# Patient Record
Sex: Female | Born: 1965 | Race: White | Hispanic: No | Marital: Single | Smoking: Never smoker
Health system: Southern US, Community
[De-identification: ages and names within clinical notes are randomized; demographics above are authoritative.]

## PROBLEM LIST (undated history)

## (undated) DIAGNOSIS — E669 Obesity, unspecified: Secondary | ICD-10-CM

## (undated) DIAGNOSIS — T7840XA Allergy, unspecified, initial encounter: Secondary | ICD-10-CM

## (undated) DIAGNOSIS — IMO0001 Reserved for inherently not codable concepts without codable children: Secondary | ICD-10-CM

## (undated) DIAGNOSIS — R51 Headache: Secondary | ICD-10-CM

## (undated) DIAGNOSIS — R112 Nausea with vomiting, unspecified: Secondary | ICD-10-CM

## (undated) DIAGNOSIS — G4730 Sleep apnea, unspecified: Secondary | ICD-10-CM

## (undated) DIAGNOSIS — K259 Gastric ulcer, unspecified as acute or chronic, without hemorrhage or perforation: Secondary | ICD-10-CM

## (undated) DIAGNOSIS — R519 Headache, unspecified: Secondary | ICD-10-CM

## (undated) DIAGNOSIS — K219 Gastro-esophageal reflux disease without esophagitis: Secondary | ICD-10-CM

## (undated) DIAGNOSIS — Z9889 Other specified postprocedural states: Secondary | ICD-10-CM

## (undated) HISTORY — PX: KNEE ARTHROSCOPY W/ ACL RECONSTRUCTION: SHX1858

## (undated) HISTORY — PX: MENISCUS REPAIR: SHX5179

## (undated) HISTORY — PX: MYOMECTOMY: SHX85

## (undated) HISTORY — DX: Obesity, unspecified: E66.9

## (undated) HISTORY — DX: Sleep apnea, unspecified: G47.30

## (undated) HISTORY — DX: Gastro-esophageal reflux disease without esophagitis: K21.9

## (undated) HISTORY — DX: Allergy, unspecified, initial encounter: T78.40XA

## (undated) HISTORY — DX: Gastric ulcer, unspecified as acute or chronic, without hemorrhage or perforation: K25.9

## (undated) HISTORY — PX: ABDOMINAL HYSTERECTOMY: SHX81

---

## 2010-07-01 HISTORY — PX: BREAST BIOPSY: SHX20

## 2011-08-26 DIAGNOSIS — G4730 Sleep apnea, unspecified: Secondary | ICD-10-CM

## 2011-08-26 HISTORY — DX: Sleep apnea, unspecified: G47.30

## 2012-04-22 ENCOUNTER — Emergency Department: Payer: Self-pay

## 2012-04-22 LAB — COMPREHENSIVE METABOLIC PANEL
Albumin: 3.8 g/dL (ref 3.4–5.0)
Anion Gap: 10 (ref 7–16)
BUN: 15 mg/dL (ref 7–18)
Bilirubin,Total: 0.3 mg/dL (ref 0.2–1.0)
Chloride: 107 mmol/L (ref 98–107)
EGFR (African American): >60
Glucose: 84 mg/dL (ref 65–99)
Osmolality: 279 (ref 275–301)
Potassium: 4.1 mmol/L (ref 3.5–5.1)
Sodium: 140 mmol/L (ref 136–145)
Total Protein: 7.1 g/dL (ref 6.4–8.2)

## 2012-04-22 LAB — CBC
HCT: 41.3 % (ref 35.0–47.0)
HGB: 13.7 g/dL (ref 12.0–16.0)
MCH: 28.7 pg (ref 26.0–34.0)
MCHC: 33.2 g/dL (ref 32.0–36.0)
MCV: 86 fL (ref 80–100)
Platelet: 289 10*3/uL (ref 150–440)
RDW: 13.6 % (ref 11.5–14.5)

## 2012-04-22 LAB — TROPONIN I: Troponin-I: <0.02 ng/mL

## 2012-04-22 LAB — LIPASE, BLOOD: Lipase: 179 U/L (ref 73–393)

## 2012-04-23 ENCOUNTER — Ambulatory Visit (INDEPENDENT_AMBULATORY_CARE_PROVIDER_SITE_OTHER): Payer: BC Managed Care – PPO | Admitting: Cardiovascular Disease

## 2012-04-23 DIAGNOSIS — R079 Chest pain, unspecified: Secondary | ICD-10-CM

## 2012-04-23 NOTE — Patient Instructions (Signed)
Your physician has requested that you have an echocardiogram. Echocardiography is a painless test that uses sound waves to create images of your heart. It provides your doctor with information about the size and shape of your heart and how well your heart's chambers and valves are working. This procedure takes approximately one hour. There are no restrictions for this procedure.  Follow up with Dr. Kirke Corin after test.

## 2012-04-23 NOTE — Procedures (Signed)
    Treadmill Stress test  Indication: Atypical chest pain.  Baseline Data:  Resting EKG shows NSR , 76 bpm, no significant ST or T wave changes. Resting blood pressure of 102/74 mm Hg Stand bruce protocal was used.  Exercise Data:  Patient exercised for 8 min 0 sec,  Peak heart rate of 168 bpm.  This was 96 % of the maximum predicted heart rate. No symptoms of chest pain or lightheadedness were reported at peak stress or in recovery.  Peak Blood pressure recorded was 138/82 Maximal work level: 10.1 METs.  Heart rate at 3 minutes in recovery was 98 bpm. BP response: Normal HR response: Normal  EKG with Exercise: Sinus tachycardia with no significant ST changes  FINAL IMPRESSION: Normal exercise stress test. No significant EKG changes concerning for ischemia. Very good exercise tolerance.  Recommendation: Echocardiogram.

## 2012-05-13 ENCOUNTER — Ambulatory Visit: Payer: BC Managed Care – PPO | Admitting: Internal Medicine

## 2012-05-21 ENCOUNTER — Other Ambulatory Visit: Payer: Self-pay

## 2012-05-21 ENCOUNTER — Other Ambulatory Visit (INDEPENDENT_AMBULATORY_CARE_PROVIDER_SITE_OTHER): Payer: BC Managed Care – PPO

## 2012-05-21 DIAGNOSIS — R079 Chest pain, unspecified: Secondary | ICD-10-CM

## 2012-05-23 ENCOUNTER — Encounter: Payer: Self-pay | Admitting: Cardiovascular Disease

## 2012-05-23 ENCOUNTER — Ambulatory Visit (INDEPENDENT_AMBULATORY_CARE_PROVIDER_SITE_OTHER): Payer: BC Managed Care – PPO | Admitting: Cardiovascular Disease

## 2012-05-23 VITALS — BP 125/81 | HR 90 | Ht 66.0 in | Wt 197.0 lb

## 2012-05-23 DIAGNOSIS — R0789 Other chest pain: Secondary | ICD-10-CM

## 2012-05-23 NOTE — Patient Instructions (Signed)
Your heart tests were fine.  Follow up as needed.  

## 2012-05-24 ENCOUNTER — Encounter: Payer: Self-pay | Admitting: Cardiovascular Disease

## 2012-05-24 DIAGNOSIS — R0789 Other chest pain: Secondary | ICD-10-CM | POA: Insufficient documentation

## 2012-05-24 NOTE — Assessment & Plan Note (Signed)
The patient had recent atypical chest pain which was likely due to stress. She had a cardiac workup that included a treadmill stress test and an echocardiogram. Both of them were normal. She has not had any further chest discomfort. No further cardiac testing is indicated at this time. The patient was instructed to followup with [place] as needed. She will be establishing with Dr. Darrick Huntsman has a primary care physician.

## 2012-05-24 NOTE — Progress Notes (Signed)
HPI  This is a 46 year old female who is here today for evaluation of chest pain. She has no significant risk factors for coronary artery disease and no previous cardiac history. She moved recently from [place] due to that the change in her job. She has been under stress since then. She had some episodes of substernal aching sensation mostly at rest and did not worsen with physical activities. She went to the emergency room at [facility] where her basic workup was unremarkable. She was referred to our office for further evaluation. She underwent a treadmill stress test which showed no evidence of ischemia. She had no chest pain on the treadmill. She had an echocardiogram done which showed no significant abnormalities. Overall she is feeling better and has not had any further chest discomfort.  No Known Allergies   Current Outpatient Prescriptions on File Prior to Visit  Medication Sig Dispense Refill  . cetirizine (ZYRTEC) 10 MG tablet Take 10 mg by mouth daily.         History reviewed. No pertinent past medical history.   Past Surgical History  Procedure Date  . Knee surgery     right knee acl reconstruction x2  . Abdominal hysterectomy      Family History  Problem Relation Age of Onset  . Heart disease Mother      History   Social History  . Marital Status: Married    Spouse Name: N/A    Number of Children: N/A  . Years of Education: N/A   Occupational History  . Not on file.   Social History Main Topics  . Smoking status: Never Smoker   . Smokeless tobacco: Not on file  . Alcohol Use: No  . Drug Use: No  . Sexually Active:    Other Topics Concern  . Not on file   Social History Narrative  . No narrative on file     ROS Constitutional: Negative for fever, chills, diaphoresis, activity change, appetite change and fatigue.  HENT: Negative for hearing loss, nosebleeds, congestion, sore throat, facial swelling, drooling, trouble swallowing, neck  pain, voice change, sinus pressure and tinnitus.  Eyes: Negative for photophobia, pain, discharge and visual disturbance.  Respiratory: Negative for apnea, cough shortness of breath and wheezing.  Cardiovascular: Negative for palpitations and leg swelling.  Gastrointestinal: Negative for nausea, vomiting, abdominal pain, diarrhea, constipation, blood in stool and abdominal distention.  Genitourinary: Negative for dysuria, urgency, frequency, hematuria and decreased urine volume.  Musculoskeletal: Negative for myalgias, back pain, joint swelling, arthralgias and gait problem.  Skin: Negative for color change, pallor, rash and wound.  Neurological: Negative for dizziness, tremors, seizures, syncope, speech difficulty, weakness, light-headedness, numbness and headaches.  Psychiatric/Behavioral: Negative for suicidal ideas, hallucinations, behavioral problems and agitation. The patient is not nervous/anxious.     PHYSICAL EXAM   BP 125/81  Pulse 90  Ht 5\' 6"  (1.676 m)  Wt 197 lb (89.359 kg)  BMI 31.80 kg/m2 Constitutional: She is oriented to person, place, and time. She appears well-developed and well-nourished. No distress.  HENT: No nasal discharge.  Head: Normocephalic and atraumatic.  Eyes: Pupils are equal and round. Right eye exhibits no discharge. Left eye exhibits no discharge.  Neck: Normal range of motion. Neck supple. No JVD present. No thyromegaly present.  Cardiovascular: Normal rate, regular rhythm, normal heart sounds. Exam reveals no gallop and no friction rub. No murmur heard.  Pulmonary/Chest: Effort normal and breath sounds normal. No stridor. No respiratory distress. She  has no wheezes. She has no rales. She exhibits no tenderness.  Abdominal: Soft. Bowel sounds are normal. She exhibits no distension. There is no tenderness. There is no rebound and no guarding.  Musculoskeletal: Normal range of motion. She exhibits no edema and no tenderness.  Neurological: She is alert  and oriented to person, place, and time. Coordination normal.  Skin: Skin is warm and dry. No rash noted. She is not diaphoretic. No erythema. No pallor.  Psychiatric: She has a normal mood and affect. Her behavior is normal. Judgment and thought content normal.      ASSESSMENT AND PLAN

## 2012-06-20 ENCOUNTER — Encounter: Payer: Self-pay | Admitting: Internal Medicine

## 2012-06-20 ENCOUNTER — Ambulatory Visit (INDEPENDENT_AMBULATORY_CARE_PROVIDER_SITE_OTHER): Payer: BC Managed Care – PPO | Admitting: Internal Medicine

## 2012-06-20 VITALS — BP 118/70 | HR 75 | Temp 98.0°F | Resp 16 | Ht 65.0 in | Wt 200.5 lb

## 2012-06-20 DIAGNOSIS — R0609 Other forms of dyspnea: Secondary | ICD-10-CM

## 2012-06-20 DIAGNOSIS — E669 Obesity, unspecified: Secondary | ICD-10-CM

## 2012-06-20 DIAGNOSIS — Z832 Family history of diseases of the blood and blood-forming organs and certain disorders involving the immune mechanism: Secondary | ICD-10-CM

## 2012-06-20 DIAGNOSIS — R0683 Snoring: Secondary | ICD-10-CM

## 2012-06-20 DIAGNOSIS — K5909 Other constipation: Secondary | ICD-10-CM

## 2012-06-20 DIAGNOSIS — L680 Hirsutism: Secondary | ICD-10-CM

## 2012-06-20 DIAGNOSIS — R5381 Other malaise: Secondary | ICD-10-CM

## 2012-06-20 DIAGNOSIS — R079 Chest pain, unspecified: Secondary | ICD-10-CM

## 2012-06-20 DIAGNOSIS — R0789 Other chest pain: Secondary | ICD-10-CM

## 2012-06-20 DIAGNOSIS — R0989 Other specified symptoms and signs involving the circulatory and respiratory systems: Secondary | ICD-10-CM

## 2012-06-20 DIAGNOSIS — K5904 Chronic idiopathic constipation: Secondary | ICD-10-CM

## 2012-06-20 DIAGNOSIS — R5383 Other fatigue: Secondary | ICD-10-CM

## 2012-06-20 LAB — BASIC METABOLIC PANEL
Chloride: 107 mEq/L (ref 96–112)
Potassium: 4.5 mEq/L (ref 3.5–5.1)
Sodium: 141 mEq/L (ref 135–145)

## 2012-06-20 LAB — C-REACTIVE PROTEIN: CRP: <1 mg/dL (ref 1–20)

## 2012-06-20 NOTE — Assessment & Plan Note (Signed)
Given her family history of VTE, relative hypoxia,   LE edema and pleuritic chest pain, need to rule out a PE with chest CT

## 2012-06-20 NOTE — Assessment & Plan Note (Signed)
Checking thyroid, ruling out DM and hyperlipidemia

## 2012-06-20 NOTE — Progress Notes (Addendum)
Patient ID: Audrey Blair, female   DOB: 1966/02/01, 46 y.o.   MRN: [id number]    Patient Active Problem List  Diagnosis  . Atypical chest pain  . Obesity (BMI 30-39.9)  . Family history of antiphospholipid syndrome  . Fatigue  . Female hirsutism  . Abdominal  pain, other specified site    Subjective:  CC:   Chief Complaint  Patient presents with  . New Patient    HPI:   Audrey Blair is a 46 y.o. female who presents as a new patient to establish primary care who recently relocated from [place], [place] about 3 months ago.  She presents with a list of test she is requesting to be run including tests for scleroderma, antiphospholipid antibody syndrome ,  testosterone levels, and a sleep study all.  She has a history of total hysterectomy in 2002 due to fibroids with myomectomy done first.  She has symptoms of abdominal discomfort, lower abdomen,  Feels tense, sometimes sharp pains,  Occurs more than monthly but less than daily.  Has frequent constipation relieved with stool softener with a stimulant laxative, which she uses once a week , sometimes more.  No prior colonoscopy.  Does not eat a high fiber diet but lots of fruits and vegetables. Drinks a few glasses of water daily.  Bowel have changed over the last year and a a half..,  No history of blood in stool.  Mother had breast ca at age in early 89's. Her mother also had antiphospholipid Ab syndrome and had  recurrent DVTs,  anemia requiring transfusions with history of gastric ulcers.  Her 2nd complaint constant fatigue, ankles and legs swell on a regular basis.  No history of DVT.  Some joint pain but history of ACL reconstruction and meniscal tear in right knee.  Feels achey.    3rd complaint is recurrent sharp chest pain radiating to under left breast,  Evaluated in ER 2 months ago with EKG and cxr which were normal, followed by a stress test and ECHO done during evaluation by Dr. Kirke Corin that were both normal .  The pain is Intermittent  throughout the day and occurs at rest. In a period of constipation. No recent travel , none since May ,  No car rides longer than an hour. Not sleeping well, waking  up frequently.  Lives alone unmarried, but family members  Have commented on snoring and apneic type breathing pattern.   Menarche at age 80,  Had continuous periods until she was put on some kind of "experimetral drug" to regulate her periods. Has hirsutism. Wants her ovaries and hormones checked.     Past Medical History  Diagnosis Date  . Obesity (BMI 30-39.9)     Past Surgical History  Procedure Date  . Knee surgery     right knee acl reconstruction x2  . Abdominal hysterectomy     Family History  Problem Relation Age of Onset  . Heart disease Mother     valvular vardiomyopathy  . Diabetes Mother   . Deep vein thrombosis Mother   . Heart disease Maternal Grandmother     heart failure, sick sinus    History   Social History  . Marital Status: Married    Spouse Name: N/A    Number of Children: N/A  . Years of Education: N/A   Occupational History  . Not on file.   Social History Main Topics  . Smoking status: Never Smoker   . Smokeless tobacco: Never Used  .  Alcohol Use: No  . Drug Use: No  . Sexually Active: Not on file   Other Topics Concern  . Not on file   Social History Narrative  . No narrative on file       No Known Allergies  Review of Systems:   The remainder of the review of systems was negative except those addressed in the HPI.   Objective:  BP 118/70  Pulse 75  Temp 98 F (36.7 C) (Oral)  Resp 16  Ht 5\' 5"  (1.651 m)  Wt 200 lb 8 oz (90.946 kg)  BMI 33.36 kg/m2  SpO2 94%  General appearance: alert, obese ooperative and appears stated age Ears: normal TM's and external ear canals both ears Throat: lips, mucosa, and tongue normal; teeth and gums normal Neck: no adenopathy, no carotid bruit, supple, symmetrical, trachea midline and thyroid not enlarged, symmetric, no  tenderness/mass/nodules Back: symmetric, no curvature. ROM normal. No CVA tenderness. Lungs: clear to auscultation bilaterally Heart: regular rate and rhythm, S1, S2 normal, no murmur, click, rub or gallop Abdomen: soft, non-tender; bowel sounds normal; no masses,  no organomegaly Pulses: 2+ and symmetric Skin: Skin color, texture, turgor normal. No rashes or lesions Lymph nodes: Cervical, supraclavicular, and axillary nodes normal.  Assessment and Plan:  Atypical chest pain Given her family history of VTE, relative hypoxia,   LE edema and pleuritic chest pain, need to rule out a PE with chest CT  Obesity (BMI 30-39.9) Checking thyroid, ruling out DM and hyperlipidemia  Fatigue Sleep study ordered to investigate symptoms of edema, fatigue, obesity and snoring   Female hirsutism Accompanied by edema.  Trial of spironolactone 50 mg daily    Updated Medication List Outpatient Encounter Prescriptions as of 06/20/2012  Medication Sig Dispense Refill  . cetirizine (ZYRTEC) 10 MG tablet Take 10 mg by mouth daily.      . IBUPROFEN PO Take 600 mg by mouth as needed.      Marland Kitchen spironolactone (ALDACTONE) 50 MG tablet 1 to 2 tablets daily as needed for edema  60 tablet  3     Orders Placed This Encounter  Procedures  . CT Chest W Contrast  . TSH  . Magnesium  . Hemoglobin A1c  . ANA  . C-reactive protein  . Basic metabolic panel  . Anti-nuclear ab-titer (ANA titer)  . Nocturnal polysomnography (NPSG)    Return in about 1 month (around 07/20/2012).

## 2012-06-20 NOTE — Assessment & Plan Note (Signed)
Sleep study ordered to investigate symptoms of edema, fatigue, obesity and snoring

## 2012-06-20 NOTE — Patient Instructions (Signed)
We will email you results as they come in .   Setting you up for CT chest and a sleep study,  We will call you with those appts.

## 2012-06-21 ENCOUNTER — Other Ambulatory Visit: Payer: Self-pay | Admitting: Internal Medicine

## 2012-06-21 DIAGNOSIS — L680 Hirsutism: Secondary | ICD-10-CM | POA: Insufficient documentation

## 2012-06-21 MED ORDER — SPIRONOLACTONE 50 MG PO TABS: ORAL_TABLET | ORAL | Status: DC

## 2012-06-21 NOTE — Addendum Note (Signed)
Addended by: Duncan Dull on: 06/21/2012 12:55 PM   Modules accepted: Orders

## 2012-06-21 NOTE — Assessment & Plan Note (Signed)
Accompanied by edema.  Trial of spironolactone 50 mg daily

## 2012-06-23 ENCOUNTER — Encounter: Payer: Self-pay | Admitting: Internal Medicine

## 2012-06-24 ENCOUNTER — Telehealth: Payer: Self-pay | Admitting: Internal Medicine

## 2012-06-24 DIAGNOSIS — M79669 Pain in unspecified lower leg: Secondary | ICD-10-CM

## 2012-06-24 LAB — ANTI-NUCLEAR AB-TITER (ANA TITER): ANA Titer 1: 1:80 {titer} — ABNORMAL HIGH

## 2012-06-24 LAB — ANA: Anti Nuclear Antibody(ANA): POSITIVE — AB

## 2012-06-25 ENCOUNTER — Ambulatory Visit: Payer: Self-pay | Admitting: Internal Medicine

## 2012-06-26 ENCOUNTER — Encounter: Payer: Self-pay | Admitting: Internal Medicine

## 2012-06-26 ENCOUNTER — Telehealth: Payer: Self-pay | Admitting: Internal Medicine

## 2012-06-26 DIAGNOSIS — R768 Other specified abnormal immunological findings in serum: Secondary | ICD-10-CM

## 2012-06-27 ENCOUNTER — Other Ambulatory Visit: Payer: Self-pay | Admitting: Internal Medicine

## 2012-06-27 DIAGNOSIS — K2970 Gastritis, unspecified, without bleeding: Secondary | ICD-10-CM

## 2012-06-27 MED ORDER — ESOMEPRAZOLE MAGNESIUM 40 MG PO CPDR: 40.0000 mg | DELAYED_RELEASE_CAPSULE | Freq: Every day | ORAL | Status: DC

## 2012-06-28 ENCOUNTER — Ambulatory Visit: Payer: Self-pay | Admitting: Internal Medicine

## 2012-07-02 ENCOUNTER — Ambulatory Visit: Payer: BC Managed Care – PPO | Admitting: Internal Medicine

## 2012-07-12 ENCOUNTER — Encounter: Payer: Self-pay | Admitting: Internal Medicine

## 2012-07-15 ENCOUNTER — Encounter: Payer: Self-pay | Admitting: Internal Medicine

## 2012-07-15 NOTE — Telephone Encounter (Signed)
Opened in error

## 2012-07-15 NOTE — Telephone Encounter (Signed)
OPened in error.

## 2012-07-16 ENCOUNTER — Encounter: Payer: Self-pay | Admitting: Internal Medicine

## 2012-09-09 ENCOUNTER — Encounter: Payer: Self-pay | Admitting: Internal Medicine

## 2012-09-12 ENCOUNTER — Ambulatory Visit: Payer: Self-pay | Admitting: Internal Medicine

## 2012-09-12 ENCOUNTER — Encounter: Payer: Self-pay | Admitting: Internal Medicine

## 2012-09-12 ENCOUNTER — Telehealth: Payer: Self-pay | Admitting: Internal Medicine

## 2012-09-12 MED ORDER — ZOLPIDEM TARTRATE 5 MG PO TABS: 5.0000 mg | ORAL_TABLET | Freq: Every evening | ORAL | Status: DC | PRN

## 2012-09-12 NOTE — Telephone Encounter (Signed)
Please call in the prescription for Ambien 5 mg per chart let patient know she should start with 1 tablet about one hour before bedtime she may repeat this in 30 minutes if she's not feeling sleepy.

## 2012-09-12 NOTE — Telephone Encounter (Signed)
This is a MyChart message from patient:  Dr Darrick Huntsman: I have my sleep study tonight. Originally you had mentioned prescribing ambien to help me sleep. it is possible for me to get a prescription today? If it's too late, that's fine. Thank you! Audrey Blair

## 2012-09-12 NOTE — Telephone Encounter (Signed)
Let detailed message notified patient, Rx called in.

## 2012-09-24 ENCOUNTER — Telehealth: Payer: Self-pay | Admitting: Internal Medicine

## 2012-09-30 ENCOUNTER — Encounter: Payer: Self-pay | Admitting: Internal Medicine

## 2012-10-18 ENCOUNTER — Ambulatory Visit (INDEPENDENT_AMBULATORY_CARE_PROVIDER_SITE_OTHER): Payer: BC Managed Care – PPO | Admitting: Internal Medicine

## 2012-10-18 ENCOUNTER — Encounter: Payer: Self-pay | Admitting: Internal Medicine

## 2012-10-18 VITALS — BP 115/64 | HR 83 | Temp 98.7°F | Ht 66.0 in | Wt 210.5 lb

## 2012-10-18 DIAGNOSIS — G8929 Other chronic pain: Secondary | ICD-10-CM

## 2012-10-18 DIAGNOSIS — R768 Other specified abnormal immunological findings in serum: Secondary | ICD-10-CM

## 2012-10-18 DIAGNOSIS — R894 Abnormal immunological findings in specimens from other organs, systems and tissues: Secondary | ICD-10-CM

## 2012-10-18 DIAGNOSIS — R5383 Other fatigue: Secondary | ICD-10-CM

## 2012-10-18 DIAGNOSIS — R1013 Epigastric pain: Secondary | ICD-10-CM

## 2012-10-18 DIAGNOSIS — R109 Unspecified abdominal pain: Secondary | ICD-10-CM | POA: Insufficient documentation

## 2012-10-18 DIAGNOSIS — R5381 Other malaise: Secondary | ICD-10-CM

## 2012-10-18 DIAGNOSIS — G4730 Sleep apnea, unspecified: Secondary | ICD-10-CM

## 2012-10-18 NOTE — Progress Notes (Signed)
Patient ID: Audrey Blair, female   DOB: Jan 19, 1966, 46 y.o.   MRN: [id number]  Patient Active Problem List  Diagnosis  . Obesity (BMI 30-39.9)  . Family history of antiphospholipid syndrome  . Fatigue  . Female hirsutism  . Abdominal  pain, other specified site  . Sleep apnea syndrome  . Positive ANA (antinuclear antibody)    Subjective:  CC:   Chief Complaint  Patient presents with  . Follow-up    HPI:   Audrey Blair a 46 y.o. female who returns for follow up on multiple issues voiced at first office visit in June. 1) She continues to have persistent pain that is located under her breasts that radiates to her xiphoid area.  The pain is constant and has been present for thee past 4 months .  It is not affected by deep breathing, eating or stooling.  It is aggravated by leaning on her side or bending over.   It did not improve with a 3 month trial of Nexium.  It does not occur with exercise but she does not exercise regularly.  It did not occur with her treadmill test several months ago.  2) Sleep apnea.  Her sleep study was positive but she has not followed up with a CPAP titration study because she does not think she could tolerate the CPAP mask. 3) Obesity.  She has gained 10 lbs since her visit in June, and 24 since April 2013.  She has been researching several weight loss programs which I am not familiar with and wanted my opinions on them. 4) Positive ANA.  She was referred to dr. Gavin Potters for rheumatologic evaluation given her mother's history  Of APLA syndrome.    Past Medical History  Diagnosis Date  . Obesity (BMI 30-39.9)   . Sleep apnea syndrome Sept 2012    diagnosed with positive sleep study     Past Surgical History  Procedure Date  . Knee surgery     right knee acl reconstruction x2  . Abdominal hysterectomy          The following portions of the patient's history were reviewed and updated as appropriate: Allergies, current medications, and problem  list.    Review of Systems:   12 Pt  review of systems was negative except those addressed in the HPI,     History   Social History  . Marital Status: Married    Spouse Name: N/A    Number of Children: N/A  . Years of Education: N/A   Occupational History  . Not on file.   Social History Main Topics  . Smoking status: Never Smoker   . Smokeless tobacco: Never Used  . Alcohol Use: No  . Drug Use: No  . Sexually Active: Not on file   Other Topics Concern  . Not on file   Social History Narrative  . No narrative on file    Objective:  BP 115/64  Pulse 83  Temp 98.7 F (37.1 C) (Oral)  Ht 5\' 6"  (1.676 m)  Wt 210 lb 8 oz (95.482 kg)  BMI 33.98 kg/m2  SpO2 96%  General appearance: alert, cooperative and appears stated age Ears: normal TM's and external ear canals both ears Throat: lips, mucosa, and tongue normal; teeth and gums normal Neck: no adenopathy, no carotid bruit, supple, symmetrical, trachea midline and thyroid not enlarged, symmetric, no tenderness/mass/nodules Back: symmetric, no curvature. ROM normal. No CVA tenderness. Lungs: clear to auscultation bilaterally Heart: regular rate and rhythm,  S1, S2 normal, no murmur, click, rub or gallop Abdomen: soft, non-tender; bowel sounds normal; no masses,  no organomegaly Pulses: 2+ and symmetric Skin: Skin color, texture, turgor normal. No rashes or lesions Lymph nodes: Cervical, supraclavicular, and axillary nodes normal.  Assessment and Plan:  Positive ANA (antinuclear antibody) In the absence of any physical signs of mixed connective tissue disease scleroderma or Renaud's.  No signs of APLA .  Positive test managed by Dr. Gavin Potters. Follow up in one year,  Sooner if she develops symptoms.   Fatigue Secondary to deconditioining and sleep apnea,  Encourage to exercise,  Low GI diet recommended.   Abdominal  pain, other specified site Thus far she has had cardiology and pulmonary causes ruled out. No  change with PPI trial x 3 months.   Referral to GI in process.   Sleep apnea syndrome Patient refuses to use CPAP. Encouraged to lose weight . Risks of untreated OSA defined.    Updated Medication List Outpatient Encounter Prescriptions as of 10/18/2012  Medication Sig Dispense Refill  . cetirizine (ZYRTEC) 10 MG tablet Take 10 mg by mouth daily.      . IBUPROFEN PO Take 600 mg by mouth as needed.      Marland Kitchen spironolactone (ALDACTONE) 50 MG tablet 1 to 2 tablets daily as needed for edema  60 tablet  3  . zolpidem (AMBIEN) 5 MG tablet Take 1 tablet (5 mg total) by mouth at bedtime as needed for sleep (may repeat in 30 minutes if needed).  30 tablet  0  . esomeprazole (NEXIUM) 40 MG capsule Take 1 capsule (40 mg total) by mouth daily.  30 capsule  3     Orders Placed This Encounter  Procedures  . Ambulatory referral to Gastroenterology    No Follow-up on file.

## 2012-10-20 ENCOUNTER — Encounter: Payer: Self-pay | Admitting: Internal Medicine

## 2012-10-20 DIAGNOSIS — G4730 Sleep apnea, unspecified: Secondary | ICD-10-CM | POA: Insufficient documentation

## 2012-10-20 DIAGNOSIS — R768 Other specified abnormal immunological findings in serum: Secondary | ICD-10-CM | POA: Insufficient documentation

## 2012-10-20 NOTE — Assessment & Plan Note (Signed)
Secondary to deconditioining and sleep apnea,  Encourage to exercise,  Low GI diet recommended.

## 2012-10-20 NOTE — Assessment & Plan Note (Signed)
Thus far she has had cardiology and pulmonary causes ruled out. No change with PPI trial x 3 months.   Referral to GI in process.

## 2012-10-20 NOTE — Assessment & Plan Note (Addendum)
Patient refuses to use CPAP. Encouraged to lose weight . Risks of untreated OSA defined.

## 2012-10-20 NOTE — Assessment & Plan Note (Signed)
In the absence of any physical signs of mixed connective tissue disease scleroderma or Renaud's.  No signs of APLA .  Positive test managed by Dr. Gavin Potters. Follow up in one year,  Sooner if she develops symptoms.

## 2012-10-22 ENCOUNTER — Encounter: Payer: Self-pay | Admitting: Internal Medicine

## 2012-10-25 ENCOUNTER — Encounter: Payer: Self-pay | Admitting: Gastroenterology

## 2012-11-07 ENCOUNTER — Ambulatory Visit: Payer: BC Managed Care – PPO | Admitting: Gastroenterology

## 2012-12-04 ENCOUNTER — Ambulatory Visit: Payer: BC Managed Care – PPO | Admitting: Internal Medicine

## 2012-12-09 ENCOUNTER — Ambulatory Visit (INDEPENDENT_AMBULATORY_CARE_PROVIDER_SITE_OTHER): Payer: BC Managed Care – PPO | Admitting: Gastroenterology

## 2012-12-09 ENCOUNTER — Encounter: Payer: Self-pay | Admitting: Gastroenterology

## 2012-12-09 VITALS — BP 124/62 | HR 67 | Ht 65.0 in | Wt 209.0 lb

## 2012-12-09 DIAGNOSIS — R109 Unspecified abdominal pain: Secondary | ICD-10-CM

## 2012-12-09 MED ORDER — HYOSCYAMINE SULFATE ER 0.375 MG PO TBCR: EXTENDED_RELEASE_TABLET | ORAL | Status: DC

## 2012-12-09 NOTE — Assessment & Plan Note (Signed)
3 month history of upper epigastric pain. Symptoms may be related to NSAID-use. Ulcer or nonulcer dyspepsia or are strong possibilities. Pain from biliary tract disease is much less likely.  Recommendations  #1 trial of hyomax 0.375 mg twice a day #2 hold NSAIDs #3 upper endoscopy

## 2012-12-09 NOTE — Progress Notes (Signed)
History of Present Illness:  46 year old white female referred for evaluation of abdominal pain. For the past 3 months she's been suffering from upper epigastric pain that radiates to both sides. Pain is often spontaneous but may be exacerbated by eating. She denies nausea, pyrosis or vomiting. She took Nexium for a couple of months without improvement. He had been taking ibuprofen regularly but now takes only twice a week. Weight has been stable.    Past Medical History  Diagnosis Date  . Obesity (BMI 30-39.9)   . Sleep apnea syndrome Sept 2012    diagnosed with positive sleep study   . GERD (gastroesophageal reflux disease)    Past Surgical History  Procedure Date  . Knee surgery     right knee acl reconstruction x2  . Abdominal hysterectomy   . Myomectomy    family history includes Deep vein thrombosis in her mother; Diabetes in her mother; and Heart disease in her maternal grandmother and mother.  There is no history of Colon cancer. Current Outpatient Prescriptions  Medication Sig Dispense Refill  . cetirizine (ZYRTEC) 10 MG tablet Take 10 mg by mouth daily.      . IBUPROFEN PO Take 600 mg by mouth as needed.      . zolpidem (AMBIEN) 5 MG tablet Take 1 tablet (5 mg total) by mouth at bedtime as needed for sleep (may repeat in 30 minutes if needed).  30 tablet  0   Allergies as of 12/09/2012  . (No Known Allergies)    reports that she has never smoked. She has never used smokeless tobacco. She reports that she does not drink alcohol or use illicit drugs.     Review of Systems: Pertinent positive and negative review of systems were noted in the above HPI section. All other review of systems were otherwise negative.  Vital signs were reviewed in today's medical record Physical Exam: General: Well developed , well nourished, no acute distress Head: Normocephalic and atraumatic Eyes:  sclerae anicteric, EOMI Ears: Normal auditory acuity Mouth: No deformity or lesions Neck:  Supple, no masses or thyromegaly Lungs: Clear throughout to auscultation Heart: Regular rate and rhythm; no murmurs, rubs or bruits Abdomen: Soft,  and non distended. No masses, hepatosplenomegaly or hernias noted. Normal Bowel sounds.  There is very mild tenderness to palpation in the subxiphoid area Rectal:deferred Musculoskeletal: Symmetrical with no gross deformities  Skin: No lesions on visible extremities Pulses:  Normal pulses noted Extremities: No clubbing, cyanosis, edema or deformities noted Neurological: Alert oriented x 4, grossly nonfocal Cervical Nodes:  No significant cervical adenopathy Inguinal Nodes: No significant inguinal adenopathy Psychological:  Alert and cooperative. Normal mood and affect

## 2012-12-10 ENCOUNTER — Ambulatory Visit (AMBULATORY_SURGERY_CENTER): Payer: BC Managed Care – PPO | Admitting: Gastroenterology

## 2012-12-10 ENCOUNTER — Encounter: Payer: Self-pay | Admitting: Gastroenterology

## 2012-12-10 VITALS — BP 120/82 | HR 72 | Temp 97.8°F | Resp 16 | Ht 65.0 in | Wt 209.0 lb

## 2012-12-10 DIAGNOSIS — K259 Gastric ulcer, unspecified as acute or chronic, without hemorrhage or perforation: Secondary | ICD-10-CM

## 2012-12-10 DIAGNOSIS — K2960 Other gastritis without bleeding: Secondary | ICD-10-CM

## 2012-12-10 DIAGNOSIS — R1013 Epigastric pain: Secondary | ICD-10-CM

## 2012-12-10 MED ORDER — PANTOPRAZOLE SODIUM 40 MG PO TBEC: 40.0000 mg | DELAYED_RELEASE_TABLET | Freq: Every day | ORAL | Status: DC

## 2012-12-10 MED ORDER — SODIUM CHLORIDE 0.9 % IV SOLN: 500.0000 mL | INTRAVENOUS | Status: DC

## 2012-12-10 NOTE — Op Note (Signed)
[facility] [address] [place], [address]   ENDOSCOPY PROCEDURE REPORT  PATIENT: Audrey Blair, Audrey Blair  MR#: [id number] BIRTHDATE: May 21, 1966 , 46  yrs. old GENDER: Female ENDOSCOPIST: Louis Meckel, MD REFERRED BY: PROCEDURE DATE:  12/10/2012 PROCEDURE:  EGD w/ biopsy ASA CLASS:     Class II INDICATIONS: MEDICATIONS: MAC sedation, administered by CRNA and propofol (Diprivan) 150mg  IV TOPICAL ANESTHETIC:  DESCRIPTION OF PROCEDURE: After the risks benefits and alternatives of the procedure were thoroughly explained, informed consent was obtained.  The [id number]-GIF [id number] [id number] endoscope was introduced through the mouth and advanced to the third portion of the duodenum. Without limitations.  The instrument was slowly withdrawn as the mucosa was fully examined.        STOMACH: Linear and shallow ulcer(s) ranging between 3-51mm in size were found in the gastric antrum.  Biopsies were taken at edge of the ulcer.  The remainder of the upper endoscopy exam was otherwise normal. Retroflexed views revealed no abnormalities.     The scope was then withdrawn from the patient and the procedure completed.  COMPLICATIONS: There were no complications. ENDOSCOPIC IMPRESSION: 1.   Ulcer(s) ranging between 3-67mm in size were found in the gastric antrum; biopsies 2.   The remainder of the upper endoscopy exam was otherwise normal  RECOMMENDATIONS: 1.  Avoid NSAIDS for two weeks 2.  Protonix 40 mg qd  REPEAT EXAM:  eSigned:  Louis Meckel, MD 12/10/2012 8:20 AM   CC:

## 2012-12-10 NOTE — Progress Notes (Signed)
Patient did not have preoperative order for IV antibiotic SSI prophylaxis. (G8918)  Patient did not experience any of the following events: a burn prior to discharge; a fall within the facility; wrong site/side/patient/procedure/implant event; or a hospital transfer or hospital admission upon discharge from the facility. (G8907)  

## 2012-12-10 NOTE — Progress Notes (Addendum)
Called to room to assist during endoscopic procedure.  Patient ID and intended procedure confirmed with present staff. Received instructions for my participation in the procedure from the performing physician.- Clide Cliff RN   Pt is a HIPAA pt.  I placed her procedure report, information, and AVS in a sealed envelope for her.  Dr.Kaplan came in and discussed results with her.  Then, I called her carepartner back to be with her and discussed the pt's do's and don'ts with her.  No results given to care partner  On AVS, I did only reviewed her diet, activity, phone numbers to call if needed, and symptoms to call about.

## 2012-12-10 NOTE — Patient Instructions (Addendum)
YOU HAD AN ENDOSCOPIC PROCEDURE TODAY AT THE [facility]: Refer to the procedure report that was given to you for any specific questions about what was found during the examination.  If the procedure report does not answer your questions, please call your gastroenterologist to clarify.  If you requested that your care partner not be given the details of your procedure findings, then the procedure report has been included in a sealed envelope for you to review at your convenience later.  YOU SHOULD EXPECT: Some feelings of bloating in the abdomen. Passage of more gas than usual.  Walking can help get rid of the air that was put into your GI tract during the procedure and reduce the bloating.  DIET: Your first meal following the procedure should be a light meal and then it is ok to progress to your normal diet.  A half-sandwich or bowl of soup is an example of a good first meal.  Heavy or fried foods are harder to digest and may make you feel nauseous or bloated.  Likewise meals heavy in dairy and vegetables can cause extra gas to form and this can also increase the bloating.  Drink plenty of fluids but you should avoid alcoholic beverages for 24 hours.  ACTIVITY: Your care partner should take you home directly after the procedure.  You should plan to take it easy, moving slowly for the rest of the day.  You can resume normal activity the day after the procedure however you should NOT DRIVE or use heavy machinery for 24 hours (because of the sedation medicines used during the test).    SYMPTOMS TO REPORT IMMEDIATELY: A gastroenterologist can be reached at any hour.  During normal business hours, 8:30 AM to 5:00 PM Monday through Friday, call [phone].  After hours and on weekends, please call the GI answering service at [phone] who will take a message and have the physician on call contact you.  Following upper endoscopy (EGD)  Vomiting of blood or coffee ground material  New  chest pain or pain under the shoulder blades  Painful or persistently difficult swallowing  New shortness of breath  Fever of 100F or higher  Black, tarry-looking stools  FOLLOW UP: If any biopsies were taken you will be contacted by phone or by letter within the next 1-3 weeks.  Call your gastroenterologist if you have not heard about the biopsies in 3 weeks.  Our staff will call the home number listed on your records the next business day following your procedure to check on you and address any questions or concerns that you may have at that time regarding the information given to you following your procedure. This is a courtesy call and so if there is no answer at the home number and we have not heard from you through the emergency physician on call, we will assume that you have returned to your regular daily activities without incident.  SIGNATURES/CONFIDENTIALITY: You and/or your care partner have signed paperwork which will be entered into your electronic medical record.  These signatures attest to the fact that that the information above on your After Visit Summary has been reviewed and is understood.  Full responsibility of the confidentiality of this discharge information lies with you and/or your care-partner.   Avoid NSAIDS (anti-inflammatories) for 2 weeks

## 2012-12-10 NOTE — Progress Notes (Signed)
Pt stable in [place]

## 2012-12-11 ENCOUNTER — Telehealth: Payer: Self-pay | Admitting: *Deleted

## 2012-12-11 NOTE — Telephone Encounter (Signed)
No answer left message to call if questions or concerns. 

## 2012-12-19 ENCOUNTER — Encounter: Payer: Self-pay | Admitting: Gastroenterology

## 2013-02-08 ENCOUNTER — Other Ambulatory Visit: Payer: Self-pay

## 2013-03-03 ENCOUNTER — Other Ambulatory Visit: Payer: Self-pay | Admitting: Gastroenterology

## 2013-05-25 IMAGING — US US EXTREM LOW VENOUS*R*
1 series · 14 of 24 positions shown · non-contrast
Comparison: none

REASON FOR EXAM: right calf pain and swelling
COMMENTS:

PROCEDURE:     PHUC - PHUC DOPPLER VEINS RT LEG EXTR  - June 28, 2012  [DATE]
RESULT:     Comparison: None

[Series 1: us extrem low venous*right* · 0.10mm/px · 14 of 24 slices shown]
[im 1/24]
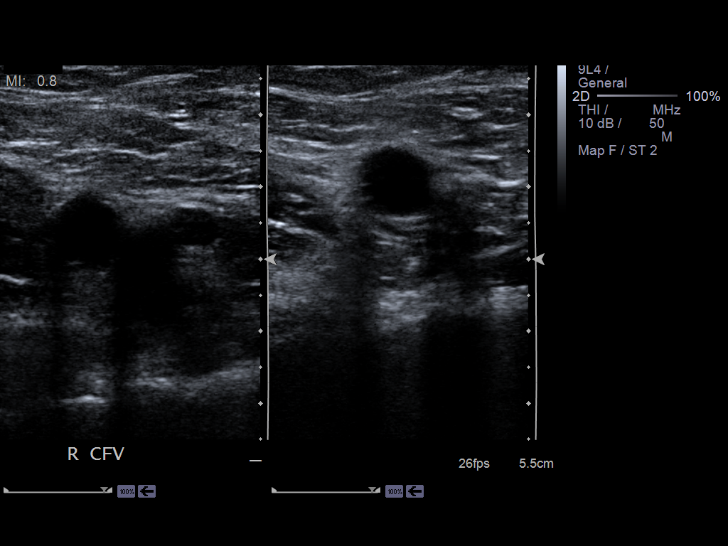
[im 3/24]
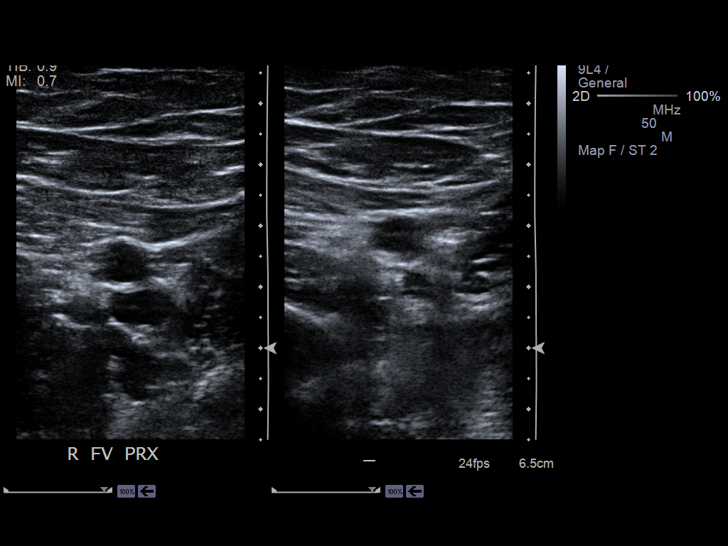
[im 5/24]
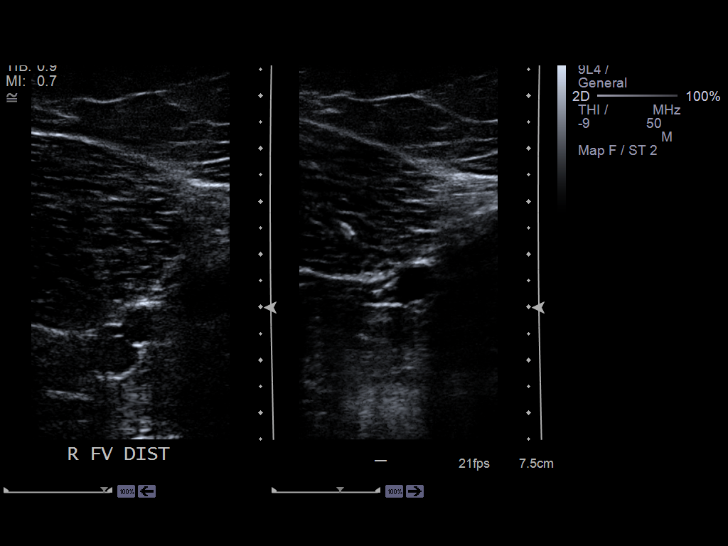
[im 7/24]
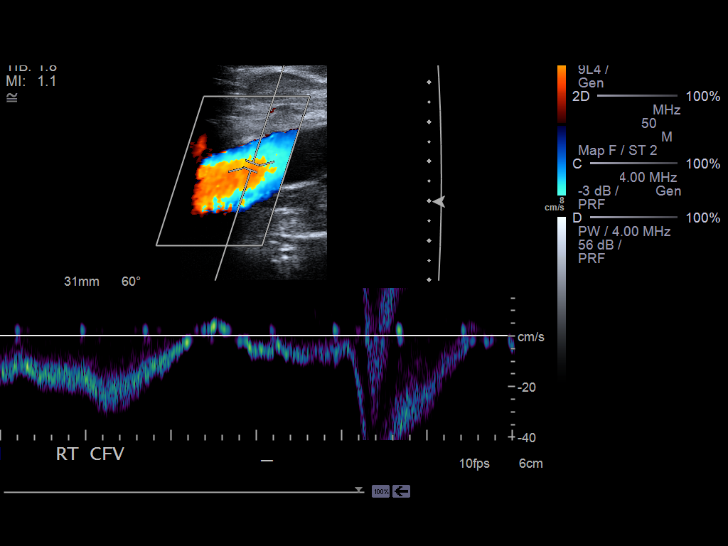
[im 8/24]
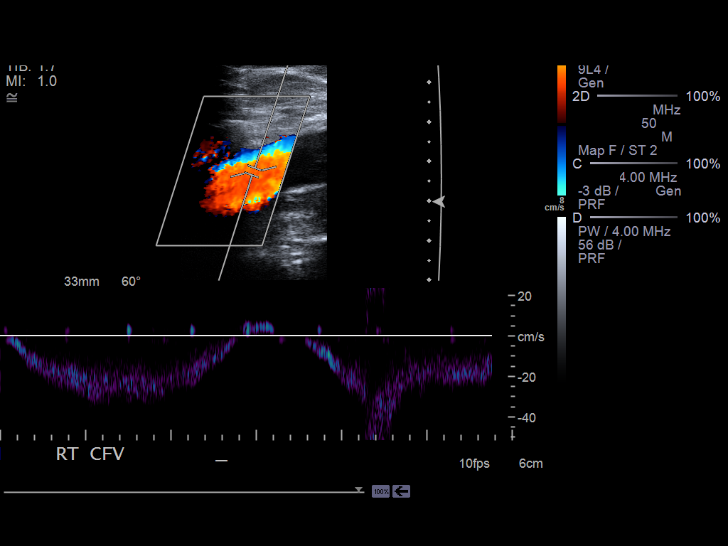
[im 10/24]
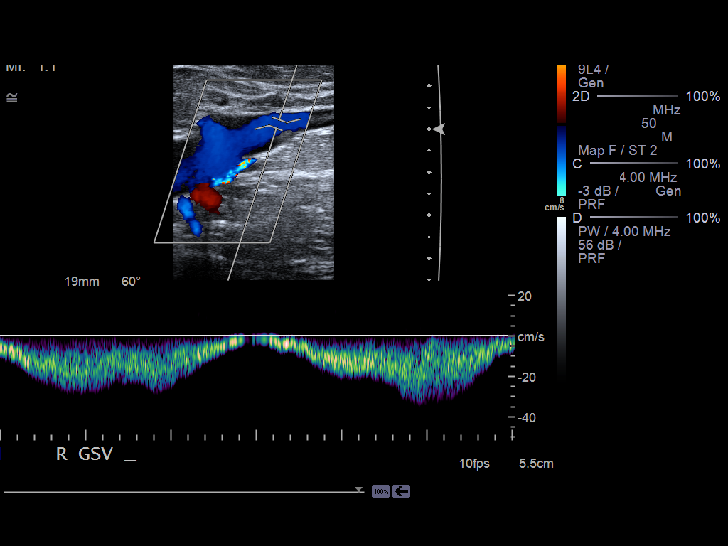
[im 12/24]
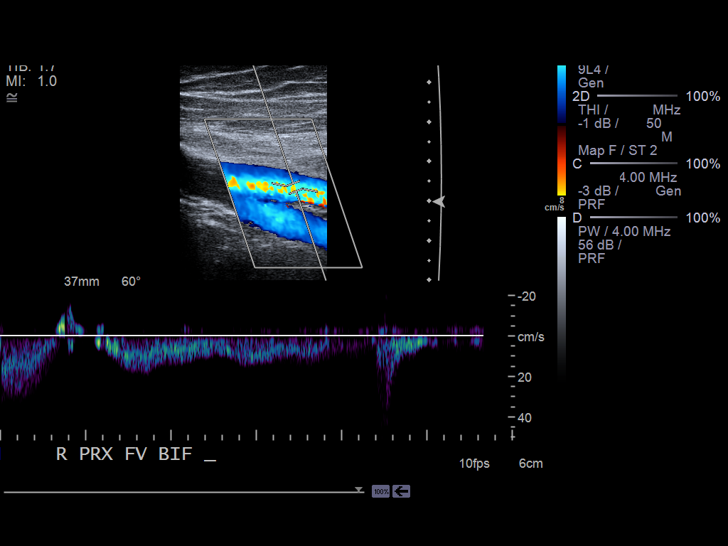
[im 13/24]
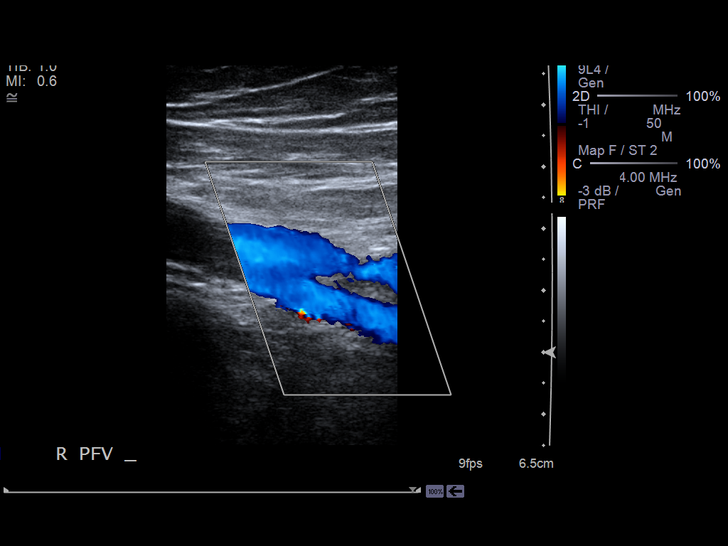
[im 15/24]
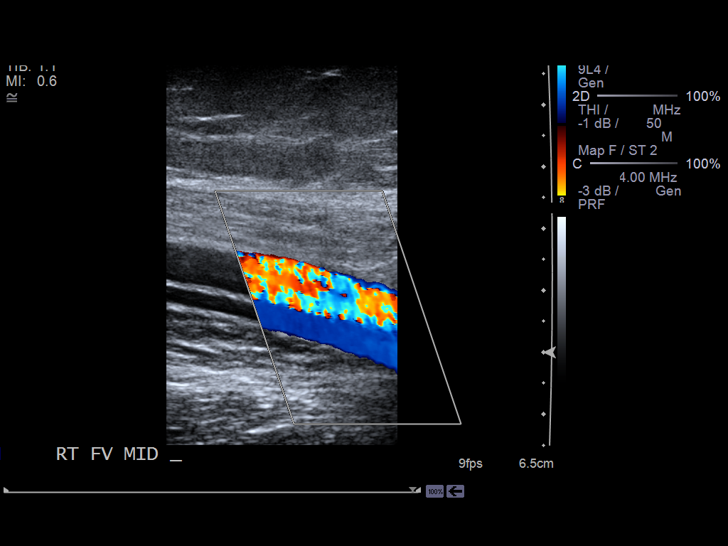
[im 17/24]
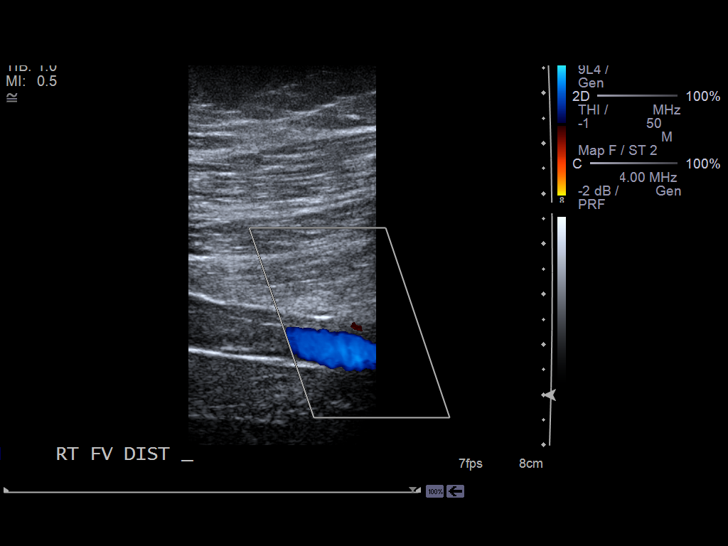
[im 19/24]
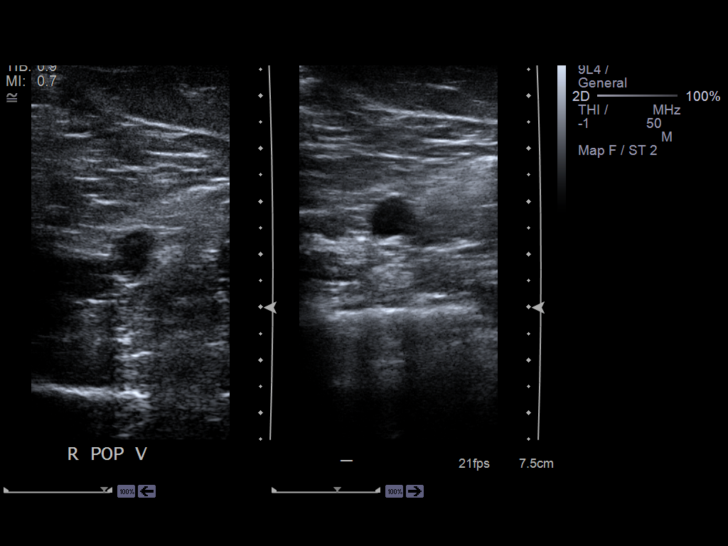
[im 20/24]
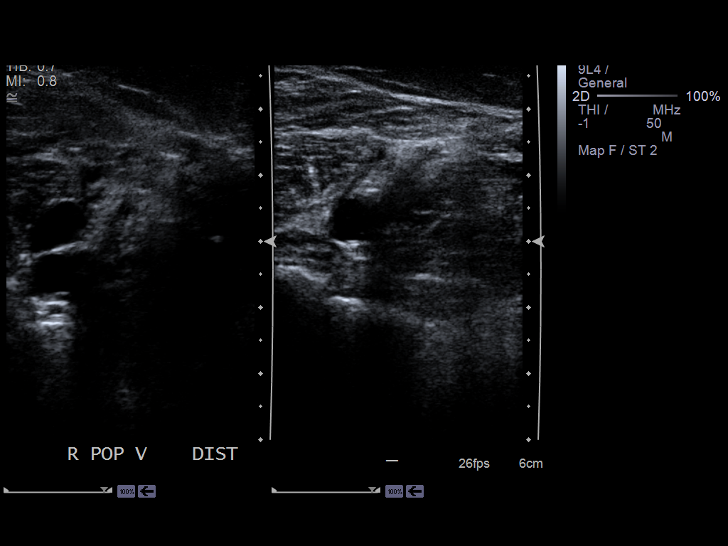
[im 22/24]
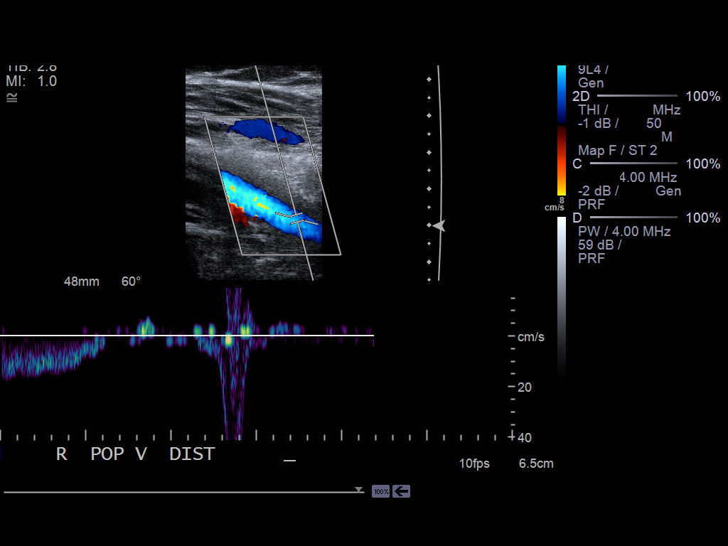
[im 24/24]
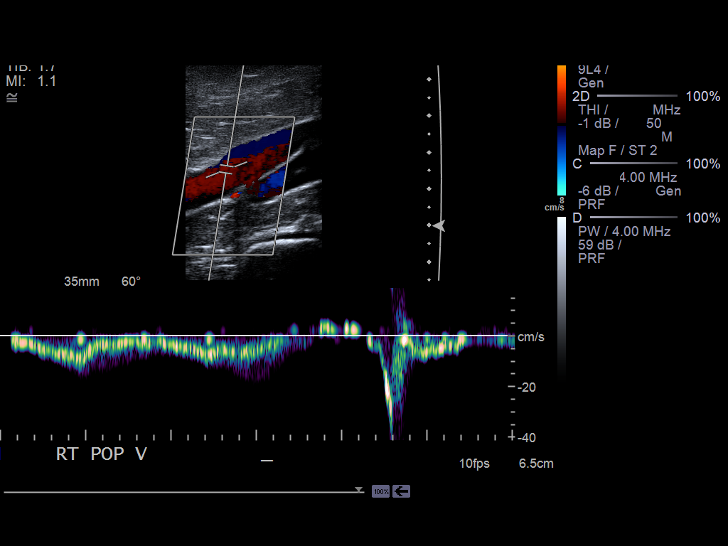

[14 of 24 positions shown; findings below may reference images not displayed]

FINDINGS: Multiple longitudinal and transverse gray-scale as well as color
and spectral Doppler images of the right lower extremity veins were obtained
from the common femoral veins through the popliteal veins.

The right common femoral, greater saphenous, femoral, popliteal veins, and
venous trifurcation are patent, demonstrating normal color-flow and
compressibility. No intraluminal thrombus is identified.There is normal
respiratory variation and augmentation demonstrated at all vein levels.
IMPRESSION: No evidence of DVT in the right lower extremity.

[REDACTED]

## 2013-07-11 ENCOUNTER — Other Ambulatory Visit: Payer: Self-pay | Admitting: Gastroenterology

## 2013-07-24 ENCOUNTER — Other Ambulatory Visit: Payer: Self-pay | Admitting: Gastroenterology

## 2013-10-30 ENCOUNTER — Other Ambulatory Visit: Payer: Self-pay

## 2013-12-24 ENCOUNTER — Other Ambulatory Visit: Payer: Self-pay | Admitting: Gastroenterology

## 2013-12-25 HISTORY — PX: BREAST CYST ASPIRATION: SHX578

## 2014-03-16 ENCOUNTER — Telehealth: Payer: Self-pay | Admitting: Internal Medicine

## 2014-03-16 DIAGNOSIS — N644 Mastodynia: Secondary | ICD-10-CM

## 2014-03-16 NOTE — Telephone Encounter (Signed)
Tullo, please advise as we have not seen patient since 12/04/12.

## 2014-03-16 NOTE — Telephone Encounter (Signed)
Order has been faxed to [facility], pt is there now

## 2014-03-16 NOTE — Telephone Encounter (Signed)
If patient is currently in the hosptial in [place],  She should have the breast looked at there bc it sounds urgent like it is infected.  What is amber asking for medical records, I have multiple messages from this patient's dtr

## 2014-03-16 NOTE — Telephone Encounter (Signed)
Pt mom in the hospital in [place] in [place]. Pt would like information sent to [facility], [place]: [phone], [place]: [phone].

## 2014-03-16 NOTE — Telephone Encounter (Signed)
Pt needing diagnostic mammogram scheduled.  Pt is in [place].    Pt having symptoms:  Right breast, right side has large lump, red, hot, painful.    Pt has been to:   [facility] Phone:  [phone]  Please call to schedule.  Pt would like ASAP appointment, any day/any time (even today if at all possible).  Call pt on cell.

## 2014-03-20 ENCOUNTER — Telehealth: Payer: Self-pay | Admitting: Internal Medicine

## 2014-03-20 DIAGNOSIS — N611 Abscess of the breast and nipple: Secondary | ICD-10-CM | POA: Insufficient documentation

## 2014-03-20 NOTE — Telephone Encounter (Signed)
Her breast biopsy  Reports showed no malignancy, but did suggest she had an abscess..  I cannot treat a patient I have not seen so I am advising her to go to Urgent Care in [facility] to get treatment

## 2014-03-23 ENCOUNTER — Ambulatory Visit (INDEPENDENT_AMBULATORY_CARE_PROVIDER_SITE_OTHER): Payer: BC Managed Care – PPO | Admitting: Internal Medicine

## 2014-03-23 ENCOUNTER — Encounter: Payer: Self-pay | Admitting: Internal Medicine

## 2014-03-23 VITALS — BP 122/74 | HR 74 | Temp 98.1°F | Resp 18 | Wt 210.8 lb

## 2014-03-23 DIAGNOSIS — K259 Gastric ulcer, unspecified as acute or chronic, without hemorrhage or perforation: Secondary | ICD-10-CM

## 2014-03-23 DIAGNOSIS — N61 Mastitis without abscess: Secondary | ICD-10-CM

## 2014-03-23 DIAGNOSIS — N611 Abscess of the breast and nipple: Secondary | ICD-10-CM

## 2014-03-23 DIAGNOSIS — Z8711 Personal history of peptic ulcer disease: Secondary | ICD-10-CM | POA: Insufficient documentation

## 2014-03-23 DIAGNOSIS — Z8719 Personal history of other diseases of the digestive system: Secondary | ICD-10-CM | POA: Insufficient documentation

## 2014-03-23 MED ORDER — SULFAMETHOXAZOLE-TMP DS 800-160 MG PO TABS: 1.0000 | ORAL_TABLET | Freq: Two times a day (BID) | ORAL | Status: DC

## 2014-03-23 MED ORDER — CULTURELLE DIGESTIVE HEALTH PO CAPS: 1.0000 | ORAL_CAPSULE | Freq: Every day | ORAL | Status: DC

## 2014-03-23 NOTE — Telephone Encounter (Signed)
Patient is back in town and would like to see you , Have 2.15  Patient scheduled

## 2014-03-23 NOTE — Progress Notes (Signed)
Patient ID: Audrey Blair, female   DOB: [birth year], 48 y.o.   MRN: [id number]  Patient Active Problem List   Diagnosis Date Noted  . Gastric ulcer 03/23/2014  . Abscess of breast 03/20/2014  . Positive ANA (antinuclear antibody) 10/20/2012  . Sleep apnea syndrome   . Abdominal  pain, other specified site 10/18/2012  . 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 Family history of antiphospholipid syndrome 06/20/2012  . Fatigue 06/20/2012  . Obesity (BMI 30-39.9)     Subjective:  CC:   Chief Complaint  Patient presents with  . Follow-up    breast aspiration    HPI:   Audrey Blair is a 48 y.o. female who presents for treatment of Resolving breast abscess,  Patient was spending prolonged stay in [place] with mother who was hospitalized  In ICU .  During her prolonged visit,.  She developed Right breast pain, redness and lump.  She was referred from her for a diagnostic mammogram and ultrasound which was done at [facility] in [facility] On March 23rd  which showed a 2 cm complex fluid collection.  The cyst was aspirated On March 24th,  3 cc of thick turbif fluid was obtained with subsequent complete collapse of cyst. No malignant cells were identified,  Gram stain and culture were sent of aspirate but no antibiotics were given.   Patient is here today for follow up, Has not been seen since 2013.  No histoyr of trauma to breast or of breastfeeding. Pain has resolved,  No drainage since procedure , no fevers erhthema  has improved per patient,.    Past Medical History  Diagnosis Date  . Obesity (BMI 30-39.9)   . Sleep apnea syndrome Sept 2012    diagnosed with positive sleep study   . GERD (gastroesophageal reflux disease)     Past Surgical History  Procedure Laterality Date  . Knee surgery      right knee acl reconstruction x2  . Abdominal hysterectomy    . Myomectomy         The following portions of the patient's history were reviewed and updated as appropriate:  Allergies, current medications, and problem list.    Review of Systems:   Patient denies headache, fevers, malaise, unintentional weight loss, skin rash, eye pain, sinus congestion and sinus pain, sore throat, dysphagia,  hemoptysis , cough, dyspnea, wheezing, chest pain, palpitations, orthopnea, edema, abdominal pain, nausea, melena, diarrhea, constipation, flank pain, dysuria, hematuria, urinary  Frequency, nocturia, numbness, tingling, seizures,  Focal weakness, Loss of consciousness,  Tremor, insomnia, depression, anxiety, and suicidal ideation.     History   Social History  . Marital Status: Married    Spouse Name: N/A    Number of Children: 0  . Years of Education: N/A   Occupational History  . Pastor    Social History Main Topics  . Smoking status: Never Smoker   . Smokeless tobacco: Never Used  . Alcohol Use: No  . Drug Use: No  . Sexual Activity: Not on file   Other Topics Concern  . Not on file   Social History Narrative   Daily caffeine     Objective:  Filed Vitals:   03/23/14 1421  BP: 122/74  Pulse: 74  Temp: 98.1 F (36.7 C)  Resp: 18     General appearance: alert, cooperative and appears stated age Ears: normal TM's and external ear canals both ears Throat: lips, mucosa, and tongue normal; teeth and gums normal Neck: no adenopathy,  no carotid bruit, supple, symmetrical, trachea midline and thyroid not enlarged, symmetric, no tenderness/mass/nodules Back: symmetric, no curvature. ROM normal. No CVA tenderness. Lungs: clear to auscultation bilaterally Heart: regular rate and rhythm, S1, S2 normal, no murmur, click, rub or gallop Breast:  Right breastL  minimal erythema,  Resolving ecchymosis,  No mass   Skin: Skin color, texture, turgor normal. No rashes or lesions Lymph nodes: Cervical, supraclavicular, and axillary nodes normal.  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 cetirizine (ZYRTEC) 10 MG tablet Take 10 mg by mouth daily.  . Multiple Vitamins-Minerals (MULTIVITAMIN WITH MINERALS) tablet Take 1 tablet by mouth daily.  . hyoscyamine (LEVBID) 0.375 MG 12 hr tablet TAKE ONE TABLET TWICE A DAY FOR 5 DAYS ABDOMINAL PAIN THEN AS NEEDED  . Lactobacillus-Inulin ([place]) CAPS Take 1 capsule by mouth daily.  . pantoprazole (PROTONIX) 40 MG tablet TAKE 1 TABLET (40 MG TOTAL) BY MOUTH DAILY.  . pantoprazole (PROTONIX) 40 MG tablet TAKE 1 TABLET (40 MG TOTAL) BY MOUTH DAILY.  Marland Kitchen sulfamethoxazole-trimethoprim (BACTRIM DS) 800-160 MG per tablet Take 1 tablet by mouth 2 (two) times daily.  Marland Kitchen zolpidem (AMBIEN) 5 MG tablet Take 1 tablet (5 mg total) by mouth at bedtime as needed for sleep (may repeat in 30 minutes if needed).     No orders of the defined types were placed in this encounter.    No Follow-up on file.

## 2014-03-23 NOTE — Telephone Encounter (Signed)
Patient stated she had an aspiration done on 03/17/14.

## 2014-03-23 NOTE — Assessment & Plan Note (Addendum)
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

## 2014-03-23 NOTE — Patient Instructions (Addendum)
Your breast abscess (and your other skin infections )  was most likely caused by a Staph infection, and should completely resolve with Septra DS   Using daily anti microbial soap (Dial,  Or lever 2000)  And weekly Hibiclens liquid soap will help if the infectiosn become recurrent.    Septra DS twice daily with food  For one week  Please take a probiotic ( Align, [place] or Culturelle) while you are on the antibiotic to prevent a serious antibiotic associated diarrhea  Called clostridium dificile colitis and a vaginal yeast infection

## 2014-05-05 ENCOUNTER — Encounter: Payer: Self-pay | Admitting: Internal Medicine

## 2014-05-05 ENCOUNTER — Other Ambulatory Visit: Payer: Self-pay | Admitting: Internal Medicine

## 2014-05-06 ENCOUNTER — Other Ambulatory Visit: Payer: Self-pay | Admitting: Internal Medicine

## 2014-05-06 DIAGNOSIS — L680 Hirsutism: Secondary | ICD-10-CM

## 2014-05-06 MED ORDER — SPIRONOLACTONE 50 MG PO TABS: ORAL_TABLET | ORAL | Status: DC

## 2014-05-06 MED ORDER — ZOLPIDEM TARTRATE 5 MG PO TABS: 5.0000 mg | ORAL_TABLET | Freq: Every evening | ORAL | Status: DC | PRN

## 2014-05-22 ENCOUNTER — Other Ambulatory Visit: Payer: Self-pay | Admitting: Internal Medicine

## 2014-05-22 ENCOUNTER — Encounter: Payer: Self-pay | Admitting: Internal Medicine

## 2014-05-25 NOTE — Telephone Encounter (Signed)
Okay to refill? Last seen on 03/23/14.

## 2014-05-26 NOTE — Telephone Encounter (Signed)
Ok to refill,  Refill sent  

## 2014-10-08 ENCOUNTER — Other Ambulatory Visit: Payer: Self-pay | Admitting: Internal Medicine

## 2014-10-08 ENCOUNTER — Telehealth: Payer: Self-pay | Admitting: *Deleted

## 2014-10-08 DIAGNOSIS — K2990 Gastroduodenitis, unspecified, without bleeding: Secondary | ICD-10-CM

## 2014-10-08 DIAGNOSIS — R1013 Epigastric pain: Secondary | ICD-10-CM

## 2014-10-08 DIAGNOSIS — R5383 Other fatigue: Secondary | ICD-10-CM

## 2014-10-08 DIAGNOSIS — R739 Hyperglycemia, unspecified: Secondary | ICD-10-CM

## 2014-10-08 DIAGNOSIS — K2970 Gastritis, unspecified, without bleeding: Secondary | ICD-10-CM

## 2014-10-08 NOTE — Telephone Encounter (Signed)
Message copied by Cecelia Byars on Thu Oct 08, 2014  4:54 PM ------      Message from: Crecencio Mc      Created: Thu Oct 08, 2014  4:36 PM       She needs a 40 minute for the multiple issues raisde go give her 4:30 on Tuesday,  If she can come in prior to that for fasting labs great.            If the tightness in jaw occurs with exertion, that can be a sign of angina and she needs to go to Er.       ----- Message -----         From: Nanci Pina, LPN         Sent: [id number]   1:30 PM           To: Crecencio Mc, MD            Dr. Vanita Ingles       I have a 15 Minute spot Tuesday, But the tightness in jaw concerned me. Tried to reach patient by phone for more information.       ----- Message -----         From: Matilde Bash         Sent: 10/08/2014   9:34 AM           To: Nanci Pina, LPN            I have received this message from pt for scheduling an appt. "Acid Re-flux (have taken Zantac, Prilosec for 14 days. can i take longer?), Ulcers (need updated script), Tightness in Jaws, potential blood-work (sugar, cholesterol, ANA panel-check), skin rash on my back". Pt was given next appt 11/24. Pt advised this "I will be out of town on November 24. Can i be put on a wait list for an opening? That is almost 1.5 months away and i have put it off for a long time already. Very uncomfortable. Or is there another physician who can assist? "                  Please advise             ------

## 2014-10-08 NOTE — Telephone Encounter (Signed)
Pt notified and verbalized understanding. Will take 10/13/14 appt @ 4:30. Will come tomorrow for fasting labs.

## 2014-10-09 ENCOUNTER — Other Ambulatory Visit (INDEPENDENT_AMBULATORY_CARE_PROVIDER_SITE_OTHER): Payer: BC Managed Care – PPO

## 2014-10-09 DIAGNOSIS — R739 Hyperglycemia, unspecified: Secondary | ICD-10-CM

## 2014-10-09 DIAGNOSIS — R1013 Epigastric pain: Secondary | ICD-10-CM

## 2014-10-09 DIAGNOSIS — K2970 Gastritis, unspecified, without bleeding: Secondary | ICD-10-CM

## 2014-10-09 DIAGNOSIS — K2990 Gastroduodenitis, unspecified, without bleeding: Secondary | ICD-10-CM

## 2014-10-09 DIAGNOSIS — R5383 Other fatigue: Secondary | ICD-10-CM

## 2014-10-09 LAB — COMPREHENSIVE METABOLIC PANEL
ALBUMIN: 3.3 g/dL — AB (ref 3.5–5.2)
ALT: 16 U/L (ref 0–35)
AST: 21 U/L (ref 0–37)
Alkaline Phosphatase: 50 U/L (ref 39–117)
BUN: 13 mg/dL (ref 6–23)
CALCIUM: 8.8 mg/dL (ref 8.4–10.5)
CHLORIDE: 109 meq/L (ref 96–112)
CO2: 20 meq/L (ref 19–32)
CREATININE: 0.8 mg/dL (ref 0.4–1.2)
GFR: 86.38 mL/min (ref >60.00)
GLUCOSE: 98 mg/dL (ref 70–99)
POTASSIUM: 3.9 meq/L (ref 3.5–5.1)
Sodium: 138 mEq/L (ref 135–145)
Total Bilirubin: 0.6 mg/dL (ref 0.2–1.2)
Total Protein: 6.9 g/dL (ref 6.0–8.3)

## 2014-10-09 LAB — LIPID PANEL
CHOLESTEROL: 193 mg/dL (ref 0–200)
HDL: 33 mg/dL — ABNORMAL LOW (ref >39.00)
LDL Cholesterol: 137 mg/dL — ABNORMAL HIGH (ref 0–99)
NonHDL: 160
TRIGLYCERIDES: 115 mg/dL (ref 0.0–149.0)
Total CHOL/HDL Ratio: 6
VLDL: 23 mg/dL (ref 0.0–40.0)

## 2014-10-09 LAB — CBC WITH DIFFERENTIAL/PLATELET
BASOS PCT: 0.4 % (ref 0.0–3.0)
Basophils Absolute: 0 10*3/uL (ref 0.0–0.1)
Eosinophils Absolute: 0.2 10*3/uL (ref 0.0–0.7)
Eosinophils Relative: 2.6 % (ref 0.0–5.0)
HCT: 41.7 % (ref 36.0–46.0)
HEMOGLOBIN: 13.7 g/dL (ref 12.0–15.0)
Lymphocytes Relative: 16.6 % (ref 12.0–46.0)
Lymphs Abs: 1.1 10*3/uL (ref 0.7–4.0)
MCHC: 32.8 g/dL (ref 30.0–36.0)
MCV: 85.3 fl (ref 78.0–100.0)
MONO ABS: 0.4 10*3/uL (ref 0.1–1.0)
Monocytes Relative: 5.5 % (ref 3.0–12.0)
NEUTROS PCT: 74.9 % (ref 43.0–77.0)
Neutro Abs: 5.2 10*3/uL (ref 1.4–7.7)
Platelets: 357 10*3/uL (ref 150.0–400.0)
RBC: 4.89 Mil/uL (ref 3.87–5.11)
RDW: 13.9 % (ref 11.5–15.5)
WBC: 6.9 10*3/uL (ref 4.0–10.5)

## 2014-10-09 LAB — LIPASE: LIPASE: 36 U/L (ref 11.0–59.0)

## 2014-10-09 LAB — H. PYLORI ANTIBODY, IGG: H Pylori IgG: NEGATIVE

## 2014-10-09 LAB — HEMOGLOBIN A1C: HEMOGLOBIN A1C: 5.5 % (ref 4.6–6.5)

## 2014-10-11 LAB — T4 AND TSH
T4, Total: 9.2 ug/dL (ref 4.5–12.0)
TSH: 3.2 u[IU]/mL (ref 0.450–4.500)

## 2014-10-13 ENCOUNTER — Ambulatory Visit (INDEPENDENT_AMBULATORY_CARE_PROVIDER_SITE_OTHER): Payer: BC Managed Care – PPO | Admitting: Internal Medicine

## 2014-10-13 ENCOUNTER — Encounter: Payer: Self-pay | Admitting: Internal Medicine

## 2014-10-13 VITALS — BP 128/74 | HR 94 | Temp 98.2°F | Resp 16 | Ht 65.0 in | Wt 219.2 lb

## 2014-10-13 DIAGNOSIS — T39395A Adverse effect of other nonsteroidal anti-inflammatory drugs [NSAID], initial encounter: Secondary | ICD-10-CM

## 2014-10-13 DIAGNOSIS — G8929 Other chronic pain: Secondary | ICD-10-CM

## 2014-10-13 DIAGNOSIS — K257 Chronic gastric ulcer without hemorrhage or perforation: Secondary | ICD-10-CM

## 2014-10-13 DIAGNOSIS — K259 Gastric ulcer, unspecified as acute or chronic, without hemorrhage or perforation: Secondary | ICD-10-CM

## 2014-10-13 DIAGNOSIS — R002 Palpitations: Secondary | ICD-10-CM | POA: Insufficient documentation

## 2014-10-13 DIAGNOSIS — Z23 Encounter for immunization: Secondary | ICD-10-CM

## 2014-10-13 DIAGNOSIS — M542 Cervicalgia: Secondary | ICD-10-CM | POA: Insufficient documentation

## 2014-10-13 MED ORDER — TRAMADOL HCL 50 MG PO TABS: 50.0000 mg | ORAL_TABLET | Freq: Three times a day (TID) | ORAL | Status: DC | PRN

## 2014-10-13 MED ORDER — PANTOPRAZOLE SODIUM 40 MG PO TBEC: 40.0000 mg | DELAYED_RELEASE_TABLET | Freq: Two times a day (BID) | ORAL | Status: DC

## 2014-10-13 NOTE — Assessment & Plan Note (Signed)
Multiple by prior EGD ,lost to follow up,  Current  history and exam  suggesting they have not resolved. Resume PPI with protonix bid and refer to GI for repeat endoscopy.  H Pylori Ab is negative.

## 2014-10-13 NOTE — Assessment & Plan Note (Signed)
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  Plain films ordered.  Advised to stop all NSAIDs given concern for gastric ulcers.,  Use tramadol and tylenol

## 2014-10-13 NOTE — Assessment & Plan Note (Signed)
Referring to Cardiology for suspicion of arrhythmia.

## 2014-10-13 NOTE — Progress Notes (Signed)
Patient ID: Audrey Blair, female   DOB: 1966/06/27, 48 y.o.   MRN: [id number]  Patient Active Problem List   Diagnosis Date Noted  . Palpitations 10/13/2014  . Neck pain of over 3 months duration 10/13/2014  . Gastric ulcer 03/23/2014  . Abscess of breast 03/20/2014  . Positive ANA (antinuclear antibody) 10/20/2012  . Sleep apnea syndrome   . Abdominal  pain, other specified site 10/18/2012  . 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 Family history of antiphospholipid syndrome 06/20/2012  . Fatigue 06/20/2012  . Obesity (BMI 30-39.9)     Subjective:  CC:   Chief Complaint  Patient presents with  . Follow-up    lab work. chest pressure, and a back flow feeling in chest.    HPI:   Audrey Blair is a 48 y.o. female who presents for evaluation of Multiple complaints:  1) She has been having recurrent episodes of chest pressure which feel like a warm flush that culminates in an involuntary cough which then relieves the feeling.  Lasts  A few seconds,  Occuring  2-3 times daily .  Not affected by caffeine,  Occurs at rest and with activity    2) recurrent episodes of bilateral neck pain  Which occurs more often on the right than on the left.     . The pain will radiate up to behind her ear and down her neck,  for the past year.  Neck will tighten for a few minutes  Has trouble chewing  For a few minutes   Before it subsides.    3) Left upper quadrant pain.  She was diagnosed with multiple  gastric ulcers 2 years ago by EGD.  Has not taken meds since 2014.  Epigastric pain has gotten worse over the past few months, and is aggravated by not eating , feels better after she eats.  Certain foods aggravate it.  LUQ is always tender to touch .  No blood in stools.  Has seen some bright red blood with hard BMs for the past week.  Alternates between hard stools and soft stools   4) Acid reflux.  Improved with prilosec x 2 weeks ,  Now just on zantac, but her symptoms were better controlled when taking  both    Past Medical History  Diagnosis Date  . Obesity (BMI 30-39.9)   . Sleep apnea syndrome Sept 2012    diagnosed with positive sleep study   . GERD (gastroesophageal reflux disease)     Past Surgical History  Procedure Laterality Date  . Knee surgery      right knee acl reconstruction x2  . Abdominal hysterectomy    . Myomectomy         The following portions of the patient's history were reviewed and updated as appropriate: Allergies, current medications, and problem list.    Review of Systems:   Patient denies headache, fevers, malaise, unintentional weight loss, skin rash, eye pain, sinus congestion and sinus pain, sore throat, dysphagia,  hemoptysis , cough, dyspnea, wheezing, chest pain, palpitations, orthopnea, edema, abdominal pain, nausea, melena, diarrhea, constipation, flank pain, dysuria, hematuria, urinary  Frequency, nocturia, numbness, tingling, seizures,  Focal weakness, Loss of consciousness,  Tremor, insomnia, depression, anxiety, and suicidal ideation.     History   Social History  . Marital Status: Married    Spouse Name: N/A    Number of Children: 0  . Years of Education: N/A   Occupational History  . Audrey Blair    Social History Main  Topics  . Smoking status: Never Smoker   . Smokeless tobacco: Never Used  . Alcohol Use: No  . Drug Use: No  . Sexual Activity: Not on file   Other Topics Concern  . Not on file   Social History Narrative   Daily caffeine     Objective:  Filed Vitals:   10/13/14 1425  BP: 128/74  Pulse: 94  Temp: 98.2 F (36.8 C)  Resp: 16     General appearance: alert, cooperative and appears stated age Ears: normal TM's and external ear canals both ears Throat: lips, mucosa, and tongue normal; teeth and gums normal Neck: no adenopathy, no carotid bruit, supple, symmetrical, trachea midline and thyroid not enlarged, symmetric, no tenderness/mass/nodules Back: symmetric, no curvature. ROM normal. No CVA  tenderness. Lungs: clear to auscultation bilaterally Heart: regular rate and rhythm, S1, S2 normal, no murmur, click, rub or gallop Abdomen: soft, tender to minimal palpation in LUQ; bowel sounds normal; no masses,  no organomegaly Pulses: 2+ and symmetric Skin: Skin color, texture, turgor normal. No rashes or lesions Lymph nodes: Cervical, supraclavicular, and axillary nodes normal.  Assessment and Plan:  Gastric ulcer Multiple by prior EGD ,lost to follow up,  Current  history and exam  suggesting they have not resolved. Resume PPI with protonix bid and refer to GI for repeat endoscopy.  H Pylori Ab is negative.   Palpitations Referring to Cardiology for suspicion of arrhythmia.   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  Plain films ordered.  Advised to stop all NSAIDs given concern for gastric ulcers.,  Use tramadol and tylenol   A total of 40 minutes was spent with patient more than half of which was spent in counseling patient on the above mentioned issues , reviewing and explaining previous serologies and endoscopy reports, and coordination of care.   Updated Medication List Outpatient Encounter Prescriptions as of 10/13/2014  Medication Sig  . cetirizine (ZYRTEC) 10 MG tablet Take 10 mg by mouth daily.  . Lactobacillus-Inulin ([place]) CAPS Take 1 capsule by mouth daily.  . Multiple Vitamins-Minerals (MULTIVITAMIN WITH MINERALS) tablet Take 1 tablet by mouth daily.  Marland Kitchen Specialty Vitamins Products (ECHINACEA C COMPLETE PO) Take 2 capsules by mouth daily.  . hyoscyamine (LEVBID) 0.375 MG 12 hr tablet TAKE ONE TABLET TWICE A DAY FOR 5 DAYS ABDOMINAL PAIN THEN AS NEEDED  . pantoprazole (PROTONIX) 40 MG tablet TAKE 1 TABLET (40 MG TOTAL) BY MOUTH DAILY.  . pantoprazole (PROTONIX) 40 MG tablet Take 1 tablet (40 mg total) by mouth 2 (two) times daily.  Marland Kitchen spironolactone (ALDACTONE) 50 MG tablet 1 to 2 tablets daily as  needed for edema  . traMADol (ULTRAM) 50 MG tablet Take 1 tablet (50 mg total) by mouth every 8 (eight) hours as needed.  . zolpidem (AMBIEN) 5 MG tablet TAKE 1 TABLET BY MOUTH AT BEDTIME AS NEEDED FOR SLEEP MAY REPEAT IF NECESSARY  . [DISCONTINUED] pantoprazole (PROTONIX) 40 MG tablet TAKE 1 TABLET (40 MG TOTAL) BY MOUTH DAILY.  . [DISCONTINUED] sulfamethoxazole-trimethoprim (BACTRIM DS) 800-160 MG per tablet Take 1 tablet by mouth 2 (two) times daily.     Orders Placed This Encounter  Procedures  . DG Cervical Spine Complete  . Ambulatory referral to Gastroenterology  . Ambulatory referral to Cardiology  . EKG 12-Lead    No Follow-up on file.

## 2014-10-13 NOTE — Patient Instructions (Signed)
I am referring you back to Dr Deatra Ina to determine if your gastric ulcers are still present  Please avoid motrin , Advil,  Aleve, and any aspirin containing product  You can usr tylenol and tramadol for pain as directed   I think your headaches and neck pain appear to be coming from arthritis  In your cervical spine pinching a nerve at the C3 level  I have ordered plain x rays to be done at the [facility], office,  But will probably need to have an MRI done at some point  Cardiology referral for the palpitations

## 2014-10-16 ENCOUNTER — Ambulatory Visit (INDEPENDENT_AMBULATORY_CARE_PROVIDER_SITE_OTHER)
Admission: RE | Admit: 2014-10-16 | Discharge: 2014-10-16 | Disposition: A | Discharge status: Home / Self Care | Payer: BC Managed Care – PPO | Source: Ambulatory Visit | Attending: Internal Medicine | Admitting: Internal Medicine

## 2014-10-16 ENCOUNTER — Encounter: Payer: Self-pay | Admitting: Internal Medicine

## 2014-10-16 DIAGNOSIS — M542 Cervicalgia: Secondary | ICD-10-CM

## 2014-10-19 ENCOUNTER — Encounter: Payer: Self-pay | Admitting: Internal Medicine

## 2014-10-19 ENCOUNTER — Encounter: Payer: Self-pay | Admitting: Cardiovascular Disease

## 2014-10-20 ENCOUNTER — Other Ambulatory Visit: Payer: Self-pay | Admitting: Internal Medicine

## 2014-10-20 DIAGNOSIS — K257 Chronic gastric ulcer without hemorrhage or perforation: Secondary | ICD-10-CM

## 2014-10-20 DIAGNOSIS — M542 Cervicalgia: Secondary | ICD-10-CM

## 2014-10-20 DIAGNOSIS — R9389 Abnormal findings on diagnostic imaging of other specified body structures: Secondary | ICD-10-CM

## 2014-10-23 ENCOUNTER — Ambulatory Visit: Payer: BC Managed Care – PPO | Admitting: Cardiovascular Disease

## 2014-10-26 ENCOUNTER — Ambulatory Visit (INDEPENDENT_AMBULATORY_CARE_PROVIDER_SITE_OTHER): Payer: BC Managed Care – PPO | Admitting: Cardiovascular Disease

## 2014-10-26 ENCOUNTER — Encounter: Payer: Self-pay | Admitting: Cardiovascular Disease

## 2014-10-26 VITALS — BP 118/88 | HR 70 | Ht 66.0 in | Wt 218.8 lb

## 2014-10-26 DIAGNOSIS — R002 Palpitations: Secondary | ICD-10-CM

## 2014-10-26 DIAGNOSIS — K259 Gastric ulcer, unspecified as acute or chronic, without hemorrhage or perforation: Secondary | ICD-10-CM

## 2014-10-26 NOTE — Patient Instructions (Signed)
Your physician has recommended that you wear a holter monitor. Holter monitors are medical devices that record the heart's electrical activity. Doctors most often use these monitors to diagnose arrhythmias. Arrhythmias are problems with the speed or rhythm of the heartbeat. The monitor is a small, portable device. You can wear one while you do your normal daily activities. This is usually used to diagnose what is causing palpitations/syncope (passing out).  Your physician recommends that you schedule a follow-up appointment in:  As needed   Your next appointment will be scheduled in our new office located at :  [place]  [address] [place]  [place], [place] [address]

## 2014-10-27 NOTE — Progress Notes (Signed)
Primary care physician: Dr. Derrel Nip  HPI  This is a 48 year old female who was referred for evaluation of palpitations. She has no previous cardiac history. She does not have significant risk factors for coronary artery disease. She does have known history of stomach ulcers and reflux. She is not a smoker and has no family history of coronary artery disease. She has been experiencing episodes of substernal chest tightness at rest with feeling flushed. This happens at rest and is not exertional. It does increase with stress. She complains of some skipping in her heart occasionally during these episodes with feeling dizzy but no syncope or presyncope. The symptoms improved with an antacid. She was given Protonix with improvement in symptoms. She is going to see gastroenterology.   No Known Allergies   Current Outpatient Prescriptions on File Prior to Visit  Medication Sig Dispense Refill  . cetirizine (ZYRTEC) 10 MG tablet Take 10 mg by mouth daily.    . Lactobacillus-Inulin ([place]) CAPS Take 1 capsule by mouth daily. (Patient taking differently: Take 1 capsule by mouth as needed. ) 14 capsule 0  . Multiple Vitamins-Minerals (MULTIVITAMIN WITH MINERALS) tablet Take 1 tablet by mouth daily.    . pantoprazole (PROTONIX) 40 MG tablet TAKE 1 TABLET (40 MG TOTAL) BY MOUTH DAILY. 30 tablet 1  . Specialty Vitamins Products (ECHINACEA C COMPLETE PO) Take 2 capsules by mouth daily.    Marland Kitchen spironolactone (ALDACTONE) 50 MG tablet 1 to 2 tablets daily as needed for edema 60 tablet 3  . traMADol (ULTRAM) 50 MG tablet Take 1 tablet (50 mg total) by mouth every 8 (eight) hours as needed. 120 tablet 0  . zolpidem (AMBIEN) 5 MG tablet TAKE 1 TABLET BY MOUTH AT BEDTIME AS NEEDED FOR SLEEP MAY REPEAT IF NECESSARY 30 tablet 3   No current facility-administered medications on file prior to visit.     Past Medical History  Diagnosis Date  . Obesity (BMI 30-39.9)   . Sleep apnea syndrome Sept  2012    diagnosed with positive sleep study   . GERD (gastroesophageal reflux disease)      Past Surgical History  Procedure Laterality Date  . Knee surgery      right knee acl reconstruction x2  . Abdominal hysterectomy    . Myomectomy       Family History  Problem Relation Age of Onset  . Heart disease Mother     valvular vardiomyopathy  . Diabetes Mother   . Deep vein thrombosis Mother   . Heart disease Maternal Grandmother     heart failure, sick sinus  . Colon cancer Neg Hx      History   Social History  . Marital Status: Married    Spouse Name: N/A    Number of Children: 0  . Years of Education: N/A   Occupational History  . Pastor    Social History Main Topics  . Smoking status: Never Smoker   . Smokeless tobacco: Never Used  . Alcohol Use: No  . Drug Use: No  . Sexual Activity: Not on file   Other Topics Concern  . Not on file   Social History Narrative   Daily caffeine      ROS A 10 point review of system was performed. It is negative other than that mentioned in the history of present illness.   PHYSICAL EXAM   BP 118/88 mmHg  Pulse 70  Ht 5\' 6"  (1.676 m)  Wt 218 lb 12 oz (  99.224 kg)  BMI 35.32 kg/m2 Constitutional: She is oriented to person, place, and time. She appears well-developed and well-nourished. No distress.  HENT: No nasal discharge.  Head: Normocephalic and atraumatic.  Eyes: Pupils are equal and round. No discharge.  Neck: Normal range of motion. Neck supple. No JVD present. No thyromegaly present.  Cardiovascular: Normal rate, regular rhythm, normal heart sounds. Exam reveals no gallop and no friction rub. No murmur heard.  Pulmonary/Chest: Effort normal and breath sounds normal. No stridor. No respiratory distress. She has no wheezes. She has no rales. She exhibits no tenderness.  Abdominal: Soft. Bowel sounds are normal. She exhibits no distension. There is no tenderness. There is no rebound and no guarding.    Musculoskeletal: Normal range of motion. She exhibits no edema and no tenderness.  Neurological: She is alert and oriented to person, place, and time. Coordination normal.  Skin: Skin is warm and dry. No rash noted. She is not diaphoretic. No erythema. No pallor.  Psychiatric: She has a normal mood and affect. Her behavior is normal. Judgment and thought content normal.     FHL:KTGYBW sinus rhythm with no significant ST or T wave changes.   ASSESSMENT AND PLAN

## 2014-10-27 NOTE — Assessment & Plan Note (Signed)
These could be due to premature beats. Cardiac exam is unremarkable and baseline ECG is normal. I requested a 48-hour Holter monitor for evaluation. There is nothing to suggest structural or ischemic heart disease.

## 2014-10-27 NOTE — Assessment & Plan Note (Signed)
I suspect that her chest pain is GI in origin. Symptoms are improving with Protonix and she is going to see a gastroenterologist. She has minimal risk factors for coronary artery disease. Thus, I will hold off on any ischemic cardiac evaluation at the present time. If symptoms persist after GI evaluation and treatment, a treadmill stress test can be considered.

## 2014-10-28 ENCOUNTER — Ambulatory Visit: Payer: BC Managed Care – PPO | Admitting: Physician Assistant

## 2014-10-31 ENCOUNTER — Ambulatory Visit
Admission: RE | Admit: 2014-10-31 | Discharge: 2014-10-31 | Disposition: A | Discharge status: Home / Self Care | Payer: BC Managed Care – PPO | Source: Ambulatory Visit | Attending: Internal Medicine | Admitting: Internal Medicine

## 2014-10-31 DIAGNOSIS — M542 Cervicalgia: Secondary | ICD-10-CM

## 2014-10-31 DIAGNOSIS — R9389 Abnormal findings on diagnostic imaging of other specified body structures: Secondary | ICD-10-CM

## 2014-11-03 ENCOUNTER — Encounter: Payer: Self-pay | Admitting: Internal Medicine

## 2014-11-03 ENCOUNTER — Other Ambulatory Visit: Payer: Self-pay | Admitting: Internal Medicine

## 2014-11-03 DIAGNOSIS — M5090 Cervical disc disorder, unspecified, unspecified cervical region: Secondary | ICD-10-CM

## 2014-11-04 ENCOUNTER — Ambulatory Visit (INDEPENDENT_AMBULATORY_CARE_PROVIDER_SITE_OTHER): Payer: BC Managed Care – PPO | Admitting: Physician Assistant

## 2014-11-04 ENCOUNTER — Encounter: Payer: Self-pay | Admitting: Physician Assistant

## 2014-11-04 ENCOUNTER — Other Ambulatory Visit (INDEPENDENT_AMBULATORY_CARE_PROVIDER_SITE_OTHER): Payer: BC Managed Care – PPO

## 2014-11-04 VITALS — BP 127/74 | HR 80 | Ht 65.25 in | Wt 218.5 lb

## 2014-11-04 DIAGNOSIS — R1013 Epigastric pain: Secondary | ICD-10-CM

## 2014-11-04 DIAGNOSIS — K219 Gastro-esophageal reflux disease without esophagitis: Secondary | ICD-10-CM

## 2014-11-04 DIAGNOSIS — K5900 Constipation, unspecified: Secondary | ICD-10-CM

## 2014-11-04 LAB — CBC WITH DIFFERENTIAL/PLATELET
BASOS PCT: 0.4 % (ref 0.0–3.0)
Basophils Absolute: 0 10*3/uL (ref 0.0–0.1)
EOS ABS: 0.2 10*3/uL (ref 0.0–0.7)
EOS PCT: 2.4 % (ref 0.0–5.0)
HCT: 43.1 % (ref 36.0–46.0)
HEMOGLOBIN: 14.1 g/dL (ref 12.0–15.0)
Lymphocytes Relative: 17 % (ref 12.0–46.0)
Lymphs Abs: 1.2 10*3/uL (ref 0.7–4.0)
MCHC: 32.8 g/dL (ref 30.0–36.0)
MCV: 84.9 fl (ref 78.0–100.0)
MONOS PCT: 6.4 % (ref 3.0–12.0)
Monocytes Absolute: 0.5 10*3/uL (ref 0.1–1.0)
NEUTROS ABS: 5.4 10*3/uL (ref 1.4–7.7)
NEUTROS PCT: 73.8 % (ref 43.0–77.0)
Platelets: 334 10*3/uL (ref 150.0–400.0)
RBC: 5.07 Mil/uL (ref 3.87–5.11)
RDW: 14.2 % (ref 11.5–15.5)
WBC: 7.3 10*3/uL (ref 4.0–10.5)

## 2014-11-04 LAB — COMPREHENSIVE METABOLIC PANEL
ALBUMIN: 3.3 g/dL — AB (ref 3.5–5.2)
ALT: 27 U/L (ref 0–35)
AST: 19 U/L (ref 0–37)
Alkaline Phosphatase: 52 U/L (ref 39–117)
BUN: 11 mg/dL (ref 6–23)
CALCIUM: 9 mg/dL (ref 8.4–10.5)
CHLORIDE: 107 meq/L (ref 96–112)
CO2: 24 meq/L (ref 19–32)
CREATININE: 0.7 mg/dL (ref 0.4–1.2)
GFR: 91.92 mL/min (ref >60.00)
Glucose, Bld: 91 mg/dL (ref 70–99)
POTASSIUM: 4.4 meq/L (ref 3.5–5.1)
Sodium: 139 mEq/L (ref 135–145)
Total Bilirubin: 0.5 mg/dL (ref 0.2–1.2)
Total Protein: 7 g/dL (ref 6.0–8.3)

## 2014-11-04 NOTE — Progress Notes (Signed)
Patient ID: Audrey Blair, female   DOB: 1966-08-31, 48 y.o.   MRN: [id number]     History of Present Illness: Audrey Blair is a 48 year old female who presents today for evaluation of severe epigastric pain. She underwent an upper endoscopy in December 2013 at which time she was noted to have ulcers ranging between 3-7 mm in size in the gastric antrum. Biopsies were negative for H. pylori, dysplasia, or malignancy. She was on a PPI for several months thereafter and then discontinued its use several months ago she began to have severe burning in the esophagus and chest pain she was seen by her primary care physician and had an EKG which was normal she started herself on over-the-counter Prilosec along with Zantac and had some relief of the pain. She discontinued the Prilosec, and continue the Zantac but continued to get heartburn with mouthfuls of bitter fluid coming up into her mouth over the past 1-2 months she's been having severe epigastric pain that is worse postprandially she never gets hungry, she feels full sooner than normal, and she feels the food does not move out of her stomach. She was started on pantoprazole 40 mg twice daily by her primary care provider. This has helped to decrease the burning in her chest, but she continues to have epigastric pain after eating that she rates as a 10 out of 10. It is not associated with nausea or vomiting, but she belches a lot. Her stools have been dry and nugget-like alternating with formed stools and she has sometimes been skipping a day between bowel movements. She denies use of nonsteroidal anti-inflammatories, but she states that she has a quad shot coffee every morning and a 6 shot latte every afternoon. She has an affinity for spicy foods especially jalapenos, and has noticed lately that jalapenos cause great distress. She does like carbonated beverages as well. And has to chew chocolate several times per week. She denies use of alcohol. Her epigastric pain has  not radiated to the right upper quadrant or shoulder blade. Her pain can last for several hours after she eats.   Past Medical History  Diagnosis Date  . Obesity (BMI 30-39.9)   . Sleep apnea syndrome Sept 2012    diagnosed with positive sleep study   . GERD (gastroesophageal reflux disease)   . Gastric ulcer     Past Surgical History  Procedure Laterality Date  . Knee arthroscopy w/ acl reconstruction Right   . Abdominal hysterectomy    . Myomectomy    . Meniscus repair Right     x 2   Family History  Problem Relation Age of Onset  . Heart disease Mother     valvular vardiomyopathy  . Diabetes Mother   . Deep vein thrombosis Mother   . Heart disease Maternal Grandmother     heart failure, sick sinus  . Colon cancer Neg Hx   . Other Mother     APLS-connective tissue disorder   History  Substance Use Topics  . Smoking status: Never Smoker   . Smokeless tobacco: Never Used  . Alcohol Use: No   Current Outpatient Prescriptions  Medication Sig Dispense Refill  . cetirizine (ZYRTEC) 10 MG tablet Take 10 mg by mouth daily.    . pantoprazole (PROTONIX) 40 MG tablet TAKE 1 TABLET (40 MG TOTAL) BY MOUTH DAILY. 30 tablet 1  . Specialty Vitamins Products (ECHINACEA C COMPLETE PO) Take 2 capsules by mouth daily.    Marland Kitchen spironolactone (ALDACTONE) 50 MG tablet  1 to 2 tablets daily as needed for edema 60 tablet 3  . traMADol (ULTRAM) 50 MG tablet Take 1 tablet (50 mg total) by mouth every 8 (eight) hours as needed. 120 tablet 0  . zolpidem (AMBIEN) 5 MG tablet TAKE 1 TABLET BY MOUTH AT BEDTIME AS NEEDED FOR SLEEP MAY REPEAT IF NECESSARY 30 tablet 3  . Lactobacillus-Inulin ([place]) CAPS Take 1 capsule by mouth daily. (Patient taking differently: Take 1 capsule by mouth as needed. ) 14 capsule 0  . Multiple Vitamins-Minerals (MULTIVITAMIN WITH MINERALS) tablet Take 1 tablet by mouth daily.     No current facility-administered medications for this visit.   No Known  Allergies    Review of Systems: Gen: Denies any fever, chills, sweats, anorexia, fatigue, weakness, malaise, weight loss, and sleep disorder CV: Denies chest pain, angina, palpitations, syncope, orthopnea, PND, peripheral edema, and claudication. Resp: Denies dyspnea at rest, dyspnea with exercise, cough, sputum, wheezing, coughing up blood, and pleurisy. GI: Denies vomiting blood, jaundice, and fecal incontinence.    GU : Denies urinary burning, blood in urine, urinary frequency, urinary hesitancy, nocturnal urination, and urinary incontinence. MS: Denies joint pain, limitation of movement, and swelling, stiffness, low back pain, extremity pain. Denies muscle weakness, cramps, atrophy.  Derm: Denies rash, itching, dry skin, hives, moles, warts, or unhealing ulcers.  Psych: Denies depression, anxiety, memory loss, suicidal ideation, hallucinations, paranoia, and confusion. Heme: Denies bruising, bleeding, and enlarged lymph nodes. Neuro:  Denies any headaches, dizziness, paresthesia Endo:  Denies any problems with DM, thyroid, adrenal  PROCEDURES: EGD 12/10/12: Ulcer(s) ranging between 3-7 mm in size were found in the gastric antrum, the remainder of the upper endoscopy was normal.Biopsies neg for h pylori., neg for dysplasia or malignancy.    Physical Exam: General: Pleasant, well developed ,female in no acute distress Head: Normocephalic and atraumatic Eyes:  sclerae anicteric, conjunctiva pink  Ears: Normal auditory acuity Lungs: Clear throughout to auscultation Heart: Regular rate and rhythm Abdomen: Soft, non distended, mild to moderate epigastric pain, no rebound or guarding. No masses, no hepatomegaly. Normal bowel sounds Musculoskeletal: Symmetrical with no gross deformities  Extremities: No edema  Neurological: Alert oriented x 4, grossly nonfocal Psychological:  Alert and cooperative. Normal mood and affect   Assessment and Recommendations: #1. Epigastric pain and  GERD. The patient has been advised to continue her pantoprazole 40 mg twice a day, 30 minutes prior to breakfast and 30 minutes prior to supper. An antireflux regimen has been reviewed at length and she's been urged to avoid spicy foods, chocolate, carbonated beverages, and to decrease her coffee intake. As her pain is at its worst postprandially, an abdominal ultrasound to evaluate the gallbladder and biliary tree will be obtained along with a comprehensive metabolic panel and a CBC. She will be scheduled for an EGD to assess for esophagitis, gastritis or ulcer.The risks, benefits, and alternatives to endoscopy with possible biopsy and possible dilation were discussed with the patient and they consent to proceed.   #2. Constipation. She's been advised to increase her water intake. She will try Benefiber 1 tablespoon daily. She's been urged to adhere to a high-fiber low-fat diet and to use a probiotic daily.  Further recommendations will be made pending the findings of her EGD, ultrasound, and blood work. If her EGD is nonrevealing and she continues to have early satiety and postprandial discomfort, she may possibly be considered for a gastric emptying scan.        Richrd Kuzniar, Vita Barley  PA-C 11/04/2014,

## 2014-11-04 NOTE — Progress Notes (Signed)
Reviewed and agree with management. Robert D. Kaplan, M.D., FACG  

## 2014-11-04 NOTE — Patient Instructions (Addendum)
Please go to the basement level to have your labs drawn.  You have been scheduled for an abdominal ultrasound at [facility] (1st floor of hospital) on 11-06-2014 at 7:30 am  . Please arrive at 7:15 am  prior to your appointment for registration. Make certain not to have anything to eat or drink 6 hours prior to your appointment. Should you need to reschedule your appointment, please contact radiology at [phone]. This test typically takes about 30 minutes to perform.  Continue the pantoprazole sodium 40 mg, twice daily. Take Benefiber, 1 tabllespoon in juice, coffee, water daily. Continue Culturelle. We have given you anti-reflux information.

## 2014-11-06 ENCOUNTER — Ambulatory Visit (HOSPITAL_COMMUNITY)
Admission: RE | Admit: 2014-11-06 | Discharge: 2014-11-06 | Disposition: A | Discharge status: Home / Self Care | Payer: BC Managed Care – PPO | Source: Ambulatory Visit | Attending: Physician Assistant | Admitting: Physician Assistant

## 2014-11-06 DIAGNOSIS — K5900 Constipation, unspecified: Secondary | ICD-10-CM | POA: Insufficient documentation

## 2014-11-06 DIAGNOSIS — K219 Gastro-esophageal reflux disease without esophagitis: Secondary | ICD-10-CM | POA: Diagnosis not present

## 2014-11-06 DIAGNOSIS — R1013 Epigastric pain: Secondary | ICD-10-CM | POA: Insufficient documentation

## 2014-11-09 ENCOUNTER — Encounter (HOSPITAL_COMMUNITY)
Admission: RE | Disposition: A | Discharge status: Home / Self Care | Payer: Self-pay | Source: Ambulatory Visit | Attending: Gastroenterology

## 2014-11-09 ENCOUNTER — Encounter (HOSPITAL_COMMUNITY): Payer: Self-pay | Admitting: *Deleted

## 2014-11-09 ENCOUNTER — Ambulatory Visit (HOSPITAL_COMMUNITY)
Admission: RE | Admit: 2014-11-09 | Discharge: 2014-11-09 | Disposition: A | Discharge status: Home / Self Care | Payer: BC Managed Care – PPO | Source: Ambulatory Visit | Attending: Gastroenterology | Admitting: Gastroenterology

## 2014-11-09 DIAGNOSIS — R1013 Epigastric pain: Secondary | ICD-10-CM | POA: Diagnosis present

## 2014-11-09 DIAGNOSIS — K5900 Constipation, unspecified: Secondary | ICD-10-CM | POA: Diagnosis not present

## 2014-11-09 DIAGNOSIS — K219 Gastro-esophageal reflux disease without esophagitis: Secondary | ICD-10-CM | POA: Diagnosis not present

## 2014-11-09 HISTORY — PX: ESOPHAGOGASTRODUODENOSCOPY: SHX5428

## 2014-11-09 HISTORY — DX: Headache: R51

## 2014-11-09 HISTORY — DX: Other specified postprocedural states: Z98.890

## 2014-11-09 HISTORY — DX: Headache, unspecified: R51.9

## 2014-11-09 HISTORY — DX: Reserved for inherently not codable concepts without codable children: IMO0001

## 2014-11-09 HISTORY — DX: Nausea with vomiting, unspecified: R11.2

## 2014-11-09 SURGERY — EGD (ESOPHAGOGASTRODUODENOSCOPY)
Anesthesia: Moderate Sedation

## 2014-11-09 MED ORDER — MIDAZOLAM HCL 10 MG/2ML IJ SOLN
INTRAMUSCULAR | Status: AC
  Filled 2014-11-09: qty 2

## 2014-11-09 MED ORDER — FENTANYL CITRATE 0.05 MG/ML IJ SOLN
INTRAMUSCULAR | Status: AC
  Filled 2014-11-09: qty 2

## 2014-11-09 MED ORDER — HYOSCYAMINE SULFATE ER 0.375 MG PO TBCR: EXTENDED_RELEASE_TABLET | ORAL | Status: DC

## 2014-11-09 MED ORDER — DIPHENHYDRAMINE HCL 50 MG/ML IJ SOLN
INTRAMUSCULAR | Status: AC
  Filled 2014-11-09: qty 1

## 2014-11-09 MED ORDER — MIDAZOLAM HCL 10 MG/2ML IJ SOLN
INTRAMUSCULAR | Status: DC | PRN
  Administered 2014-11-09 (×2): 1 mg via INTRAVENOUS
  Administered 2014-11-09 (×2): 2 mg via INTRAVENOUS

## 2014-11-09 MED ORDER — SODIUM CHLORIDE 0.9 % IV SOLN
INTRAVENOUS | Status: DC
  Administered 2014-11-09: 500 mL via INTRAVENOUS

## 2014-11-09 MED ORDER — BUTAMBEN-TETRACAINE-BENZOCAINE 2-2-14 % EX AERO
INHALATION_SPRAY | CUTANEOUS | Status: DC | PRN
  Administered 2014-11-09: 2 via TOPICAL

## 2014-11-09 MED ORDER — DIPHENHYDRAMINE HCL 50 MG/ML IJ SOLN
INTRAMUSCULAR | Status: DC | PRN
  Administered 2014-11-09: 25 mg via INTRAVENOUS

## 2014-11-09 MED ORDER — FENTANYL CITRATE 0.05 MG/ML IJ SOLN
INTRAMUSCULAR | Status: DC | PRN
  Administered 2014-11-09 (×2): 25 ug via INTRAVENOUS
  Administered 2014-11-09 (×2): 12.5 ug via INTRAVENOUS

## 2014-11-09 NOTE — Discharge Instructions (Signed)
Gastroscopy, Care After  Refer to this sheet in the next few weeks. These instructions provide you with information on caring for yourself after your procedure. Your caregiver may also give you more specific instructions. Your treatment has been planned according to current medical practices, but problems sometimes occur. Call your caregiver if you have any problems or questions after your procedure.  HOME CARE INSTRUCTIONS  · Avoid hot and warm beverages for the first 24 hours after the procedure.  · You may return to your normal diet and activities on the day after your procedure, or as directed by your caregiver.  · Only take over-the-counter or prescription medicines for pain, discomfort, or fever as directed by your caregiver.  · If you were given medicine to help you relax (sedative), do not drive or operate machinery for 24 hours.  SEEK IMMEDIATE MEDICAL CARE IF:  · You vomit blood or material that looks like coffee grounds.  · You have bloody, black, or tarry stools.  · You have shortness of breath.  · You have a fever.  · You have increasing abdominal pain that is not relieved with medicine.  · You have severe throat pain.  MAKE SURE YOU:  · Understand these instructions.  · Will watch your condition.  · Will get help right away if you are not doing well or get worse.  Document Released: 06/11/2012 Document Revised: 04/27/2014 Document Reviewed: 06/11/2012  ExitCare® Patient Information ©2015 [employer]. This information is not intended to replace advice given to you by your health care provider. Make sure you discuss any questions you have with your health care provider.

## 2014-11-09 NOTE — Interval H&P Note (Signed)
History and Physical Interval Note:  11/09/2014 12:29 PM  Audrey Blair  has presented today for surgery, with the diagnosis of GERD Epigastric pain Constipation  The various methods of treatment have been discussed with the patient and family. After consideration of risks, benefits and other options for treatment, the patient has consented to  Procedure(s): ESOPHAGOGASTRODUODENOSCOPY (EGD) (N/A) as a surgical intervention .  The patient's history has been reviewed, patient examined, no change in status, stable for surgery.  I have reviewed the patient's chart and labs.  Questions were answered to the patient's satisfaction.    The recent H&P (dated *11/04/14**) was reviewed, the patient was examined and there is no change in the patients condition since that H&P was completed.   Erskine Emery  11/09/2014, 12:29 PM    Erskine Emery

## 2014-11-09 NOTE — H&P (View-Only) (Signed)
Reviewed and agree with management. Thayne Cindric D. Sanye Ledesma, M.D., FACG  

## 2014-11-09 NOTE — Op Note (Signed)
[facility] [place], [address]   ENDOSCOPY PROCEDURE REPORT  PATIENT: Audrey Blair, Audrey Blair  MR#: [id number] BIRTHDATE: 1966/01/16 , 14  yrs. old GENDER: female ENDOSCOPIST: Inda Castle, MD REFERRED BY: PROCEDURE DATE:  11/09/2014 PROCEDURE:  EGD, diagnostic ASA CLASS:     Class II INDICATIONS:  epigastric pain. MEDICATIONS: Versed 6 mg IV, Fentanyl 75 mcg IV, and Benadryl 25 mg IV TOPICAL ANESTHETIC: Cetacaine Spray  DESCRIPTION OF PROCEDURE: After the risks benefits and alternatives of the procedure were thoroughly explained, informed consent was obtained.  The Pentax Gastroscope [id number] endoscope was introduced through the mouth and advanced to the second portion of the duodenum , Without limitations.  The instrument was slowly withdrawn as the mucosa was fully examined.      EXAM: The esophagus and gastroesophageal junction were completely normal in appearance.  The stomach was entered and closely examined.The antrum, angularis, and lesser curvature were well visualized, including a retroflexed view of the cardia and fundus. The stomach wall was normally distensable.  The scope passed easily through the pylorus into the duodenum.  Retroflexed views revealed no abnormalities.     The scope was then withdrawn from the patient and the procedure completed.  COMPLICATIONS: There were no immediate complications.  ENDOSCOPIC IMPRESSION: Normal appearing esophagus and GE junction, the stomach was well visualized and normal in appearance, normal appearing duodenum  RECOMMENDATIONS: HIDA scan Trial of hyomax  REPEAT EXAM:  eSigned:  Inda Castle, MD 11/09/2014 1:57 PM    CC:

## 2014-11-10 ENCOUNTER — Encounter (HOSPITAL_COMMUNITY): Payer: Self-pay | Admitting: Gastroenterology

## 2014-11-25 ENCOUNTER — Encounter: Payer: Self-pay | Admitting: Physician Assistant

## 2014-11-26 ENCOUNTER — Other Ambulatory Visit: Payer: Self-pay

## 2014-11-26 DIAGNOSIS — R1013 Epigastric pain: Secondary | ICD-10-CM

## 2014-11-26 NOTE — Telephone Encounter (Signed)
HIDA scan ordered and scheduled for 11/30/14 at 9:30 am in the [facility]. Left message for the patient to call.

## 2014-11-30 ENCOUNTER — Ambulatory Visit (HOSPITAL_COMMUNITY)
Admission: RE | Admit: 2014-11-30 | Discharge: 2014-11-30 | Disposition: A | Discharge status: Home / Self Care | Payer: BC Managed Care – PPO | Source: Ambulatory Visit | Attending: Gastroenterology | Admitting: Gastroenterology

## 2014-11-30 ENCOUNTER — Ambulatory Visit: Payer: BC Managed Care – PPO | Admitting: Gastroenterology

## 2014-11-30 DIAGNOSIS — R1013 Epigastric pain: Secondary | ICD-10-CM | POA: Diagnosis present

## 2014-11-30 MED ORDER — TECHNETIUM TC 99M MEBROFENIN IV KIT: 5.5000 | PACK | Freq: Once | INTRAVENOUS | Status: AC | PRN

## 2014-12-01 ENCOUNTER — Telehealth: Payer: Self-pay

## 2014-12-01 ENCOUNTER — Other Ambulatory Visit: Payer: Self-pay

## 2014-12-01 ENCOUNTER — Encounter: Payer: Self-pay | Admitting: Gastroenterology

## 2014-12-01 MED ORDER — HYOSCYAMINE SULFATE ER 0.375 MG PO TBCR: 1.0000 | EXTENDED_RELEASE_TABLET | Freq: Four times a day (QID) | ORAL | Status: DC | PRN

## 2014-12-01 NOTE — Telephone Encounter (Signed)
I have left message for the patient to call back. I need to know if she is taking the Hyomax and if she is does it help or change her sx's.

## 2014-12-01 NOTE — Telephone Encounter (Signed)
She is agreeable to this. The plan is to take the hyomax 3 to 4 times a day. She will call me back in 2 weeks with an update on her symptoms. She is looking for a name to the condition she is dealing with. I told her we would get to the point that we had a "label" for this, but it may take a little bit of trial and error to discover what it takes to make her better. She is a very pleasant person.

## 2014-12-01 NOTE — Progress Notes (Signed)
Quick Note:  Please inform the patient that HIDA was normal and to proceed with EGD ______

## 2014-12-01 NOTE — Telephone Encounter (Signed)
Patient takes 1 hyomax daily and sometime supplements with a second hyomax in the evening. She also takes Protonix, a probiotic and tramadol (for pain from the neck spurs). She could tell a difference when she had to stop all of the medications for the HIDA scan. When she ate food after the test, she had a lot of pain. She has gradually improved since starting back on the medication. So, yes, she confirms she take Hyomax and though it does not complete alleviate her symptoms, she thinks it may help some.

## 2014-12-01 NOTE — Telephone Encounter (Signed)
Have her try hyomax 3-4 times daily.

## 2015-01-01 ENCOUNTER — Other Ambulatory Visit: Payer: Self-pay | Admitting: Internal Medicine

## 2015-01-01 NOTE — Telephone Encounter (Signed)
Last OV and refill 10.20.15.  Please advise refill

## 2015-01-01 NOTE — Telephone Encounter (Signed)
I can call [place] to call?

## 2015-01-01 NOTE — Telephone Encounter (Signed)
I will not be able to refill until I return bc tramadol is a controlled substance.  Since she has seen GI, they may be able to refill it.

## 2015-02-06 ENCOUNTER — Other Ambulatory Visit: Payer: Self-pay | Admitting: Internal Medicine

## 2015-02-25 ENCOUNTER — Telehealth: Payer: Self-pay | Admitting: *Deleted

## 2015-02-25 NOTE — Telephone Encounter (Signed)
Spoke with patient and she mentioned that she never wore holter monitor due to not hearing back from [place] in a timely fashion. She has since then been seeing another Dr. For other issues and does not feel like her problems are her heart . She would not like monitor placed at this time.

## 2015-05-20 ENCOUNTER — Encounter: Payer: Self-pay | Admitting: Internal Medicine

## 2015-05-20 NOTE — Telephone Encounter (Signed)
Mychart

## 2015-05-28 ENCOUNTER — Telehealth: Payer: Self-pay | Admitting: Internal Medicine

## 2015-05-28 NOTE — Telephone Encounter (Signed)
RecDeloris Blair msg from pt requesting the following:  Hepatitis A & B (even if had before - Can I start and complete when I return since over a series of months?)    Typhoid (even if had before)   Malaria injection or pills   Tdap (Tetanus, diphtheria and pertussis Booster)   please advise if okay to schedule pt for nurse visit/msn

## 2015-05-28 NOTE — Telephone Encounter (Signed)
Please  see see unrouted message response

## 2015-05-28 NOTE — Telephone Encounter (Signed)
Yeas you can schedule an RN visit for the TDaP and Hep a and B vaccine series.  I recommend the [facility] or [facility] for the typhoid, malaria ,  It is much cheaper than the [facility]

## 2015-05-28 NOTE — Telephone Encounter (Signed)
Ok to schedule her Tdap and hep series? Where do you recommend for typhoid and malaria?

## 2015-05-28 NOTE — Telephone Encounter (Signed)
Sent mychart to scheduled nurse visit.

## 2015-06-07 ENCOUNTER — Encounter: Payer: Self-pay | Admitting: Internal Medicine

## 2015-06-07 DIAGNOSIS — M130 Polyarthritis, unspecified: Secondary | ICD-10-CM

## 2015-06-07 DIAGNOSIS — R200 Anesthesia of skin: Secondary | ICD-10-CM

## 2015-06-07 DIAGNOSIS — R202 Paresthesia of skin: Principal | ICD-10-CM

## 2015-06-08 ENCOUNTER — Ambulatory Visit (INDEPENDENT_AMBULATORY_CARE_PROVIDER_SITE_OTHER): Payer: BLUE CROSS/BLUE SHIELD

## 2015-06-08 ENCOUNTER — Encounter: Payer: Self-pay | Admitting: Internal Medicine

## 2015-06-08 DIAGNOSIS — R202 Paresthesia of skin: Secondary | ICD-10-CM | POA: Diagnosis not present

## 2015-06-08 DIAGNOSIS — R200 Anesthesia of skin: Secondary | ICD-10-CM

## 2015-06-08 DIAGNOSIS — M130 Polyarthritis, unspecified: Secondary | ICD-10-CM

## 2015-06-08 DIAGNOSIS — Z23 Encounter for immunization: Secondary | ICD-10-CM

## 2015-06-08 LAB — VITAMIN B12: VITAMIN B 12: 392 pg/mL (ref 211–911)

## 2015-06-08 LAB — TSH: TSH: 2.08 u[IU]/mL (ref 0.35–4.50)

## 2015-06-08 MED ORDER — TETANUS-DIPHTH-ACELL PERTUSSIS 5-2.5-18.5 LF-MCG/0.5 IM SUSP
0.5000 mL | Freq: Once | INTRAMUSCULAR | Status: AC
  Administered 2015-06-08: 0.5 mL via INTRAMUSCULAR

## 2015-06-08 MED ORDER — HEPATITIS A-HEP B RECOMB VAC 720-20 ELU-MCG/ML IM SUSP
1.0000 mL | Freq: Once | INTRAMUSCULAR | Status: AC
  Administered 2015-06-08: 1 mL via INTRAMUSCULAR

## 2015-06-08 NOTE — Progress Notes (Signed)
Patient came in for immunizations for upcoming mission trip.  Received Tdap and HepA/B combo.  Patient given [facility] Reminder card with additional two doses of Hep A/B immunizations.   Patient tolerated well.

## 2015-06-09 LAB — ANA W/REFLEX IF POSITIVE
ANA: POSITIVE — AB
Anti JO-1: <0.2 AI (ref 0.0–0.9)
Centromere Ab Screen: 4.6 AI — ABNORMAL HIGH (ref 0.0–0.9)
Chromatin Ab SerPl-aCnc: 0.2 AI (ref 0.0–0.9)
ENA SM Ab Ser-aCnc: <0.2 AI (ref 0.0–0.9)
ENA SSA (RO) Ab: <0.2 AI (ref 0.0–0.9)
ENA SSB (LA) Ab: <0.2 AI (ref 0.0–0.9)
Scleroderma SCL-70: <0.2 AI (ref 0.0–0.9)

## 2015-06-09 LAB — RPR

## 2015-06-10 ENCOUNTER — Telehealth: Payer: Self-pay

## 2015-06-10 NOTE — Telephone Encounter (Signed)
Patient came to the office to have her vaccine sites checked.  Patient had a reddened area around her tdap injection site that was warm to touch.  Localized reaction.  Spoke with Dr. Derrel Nip and she agreed.  Patient also was concerned about a spider bite on her right hip region.  The bite was from approximately a week ago.  Told patient if it got worse, she would need a appointment with the provider.  Patient verbalized understanding.

## 2015-06-11 ENCOUNTER — Encounter: Payer: Self-pay | Admitting: Internal Medicine

## 2015-06-11 ENCOUNTER — Other Ambulatory Visit: Payer: Self-pay | Admitting: *Deleted

## 2015-06-11 DIAGNOSIS — L680 Hirsutism: Secondary | ICD-10-CM

## 2015-06-11 MED ORDER — SPIRONOLACTONE 50 MG PO TABS: ORAL_TABLET | ORAL | Status: DC

## 2015-07-08 ENCOUNTER — Ambulatory Visit (INDEPENDENT_AMBULATORY_CARE_PROVIDER_SITE_OTHER): Payer: BLUE CROSS/BLUE SHIELD | Admitting: *Deleted

## 2015-07-08 DIAGNOSIS — Z23 Encounter for immunization: Secondary | ICD-10-CM | POA: Diagnosis not present

## 2015-07-09 ENCOUNTER — Other Ambulatory Visit: Payer: Self-pay | Admitting: Physician Assistant

## 2015-07-09 ENCOUNTER — Other Ambulatory Visit: Payer: Self-pay | Admitting: Internal Medicine

## 2015-09-08 ENCOUNTER — Ambulatory Visit (INDEPENDENT_AMBULATORY_CARE_PROVIDER_SITE_OTHER): Payer: BLUE CROSS/BLUE SHIELD | Admitting: Physician Assistant

## 2015-09-08 ENCOUNTER — Other Ambulatory Visit: Payer: Self-pay

## 2015-09-08 ENCOUNTER — Encounter: Payer: Self-pay | Admitting: Physician Assistant

## 2015-09-08 VITALS — BP 120/60 | HR 78 | Temp 98.8°F | Resp 16 | Ht 66.0 in | Wt 190.6 lb

## 2015-09-08 DIAGNOSIS — G4700 Insomnia, unspecified: Secondary | ICD-10-CM | POA: Diagnosis not present

## 2015-09-08 DIAGNOSIS — R519 Headache, unspecified: Secondary | ICD-10-CM

## 2015-09-08 DIAGNOSIS — Z7689 Persons encountering health services in other specified circumstances: Secondary | ICD-10-CM

## 2015-09-08 DIAGNOSIS — F329 Major depressive disorder, single episode, unspecified: Secondary | ICD-10-CM

## 2015-09-08 DIAGNOSIS — R51 Headache: Secondary | ICD-10-CM

## 2015-09-08 DIAGNOSIS — F419 Anxiety disorder, unspecified: Secondary | ICD-10-CM

## 2015-09-08 DIAGNOSIS — F32A Depression, unspecified: Secondary | ICD-10-CM

## 2015-09-08 DIAGNOSIS — Z7189 Other specified counseling: Secondary | ICD-10-CM | POA: Diagnosis not present

## 2015-09-08 MED ORDER — BUPROPION HCL ER (XL) 150 MG PO TB24: 150.0000 mg | ORAL_TABLET | Freq: Every day | ORAL | Status: DC

## 2015-09-08 MED ORDER — ALPRAZOLAM 0.25 MG PO TABS: 0.2500 mg | ORAL_TABLET | Freq: Two times a day (BID) | ORAL | Status: DC | PRN

## 2015-09-08 NOTE — Patient Instructions (Signed)
Bupropion extended-release tablets (Depression/Mood Disorders) What is this medicine? BUPROPION (byoo PROE pee on) is used to treat depression. This medicine may be used for other purposes; ask your health care provider or pharmacist if you have questions. COMMON BRAND NAME(S): Aplenzin, Budeprion XL, Forfivo XL, Wellbutrin XL What should I tell my health care provider before I take this medicine? They need to know if you have any of these conditions: -an eating disorder, such as anorexia or bulimia -bipolar disorder or psychosis -diabetes or high blood sugar, treated with medication -glaucoma -head injury or brain tumor -heart disease, previous heart attack, or irregular heart beat -high blood pressure -kidney or liver disease -seizures (convulsions) -suicidal thoughts or a previous suicide attempt -Tourette's syndrome -weight loss -an unusual or allergic reaction to bupropion, other medicines, foods, dyes, or preservatives -breast-feeding -pregnant or trying to become pregnant How should I use this medicine? Take this medicine by mouth with a glass of water. Follow the directions on the prescription label. You can take it with or without food. If it upsets your stomach, take it with food. Do not crush, chew, or cut these tablets. This medicine is taken once daily at the same time each day. Do not take your medicine more often than directed. Do not stop taking this medicine suddenly except upon the advice of your doctor. Stopping this medicine too quickly may cause serious side effects or your condition may worsen. A special MedGuide will be given to you by the pharmacist with each prescription and refill. Be sure to read this information carefully each time. Talk to your pediatrician regarding the use of this medicine in children. Special care may be needed. Overdosage: If you think you have taken too much of this medicine contact a poison control center or emergency room at once. NOTE:  This medicine is only for you. Do not share this medicine with others. What if I miss a dose? If you miss a dose, skip the missed dose and take your next tablet at the regular time. Do not take double or extra doses. What may interact with this medicine? Do not take this medicine with any of the following medications: -linezolid -MAOIs like Azilect, Carbex, Eldepryl, Marplan, Nardil, and Parnate -methylene blue (injected into a vein) -other medicines that contain bupropion like Zyban This medicine may also interact with the following medications: -alcohol -certain medicines for anxiety or sleep -certain medicines for blood pressure like metoprolol, propranolol -certain medicines for depression or psychotic disturbances -certain medicines for HIV or AIDS like efavirenz, lopinavir, nelfinavir, ritonavir -certain medicines for irregular heart beat like propafenone, flecainide -certain medicines for Parkinson's disease like amantadine, levodopa -certain medicines for seizures like carbamazepine, phenytoin, phenobarbital -cimetidine -clopidogrel -cyclophosphamide -furazolidone -isoniazid -nicotine -orphenadrine -procarbazine -steroid medicines like prednisone or cortisone -stimulant medicines for attention disorders, weight loss, or to stay awake -tamoxifen -theophylline -thiotepa -ticlopidine -tramadol -warfarin This list may not describe all possible interactions. Give your health care provider a list of all the medicines, herbs, non-prescription drugs, or dietary supplements you use. Also tell them if you smoke, drink alcohol, or use illegal drugs. Some items may interact with your medicine. What should I watch for while using this medicine? Tell your doctor if your symptoms do not get better or if they get worse. Visit your doctor or health care professional for regular checks on your progress. Because it may take several weeks to see the full effects of this medicine, it is  important to continue your treatment as prescribed   by your doctor. Patients and their families should watch out for new or worsening thoughts of suicide or depression. Also watch out for sudden changes in feelings such as feeling anxious, agitated, panicky, irritable, hostile, aggressive, impulsive, severely restless, overly excited and hyperactive, or not being able to sleep. If this happens, especially at the beginning of treatment or after a change in dose, call your health care professional. Avoid alcoholic drinks while taking this medicine. Drinking large amounts of alcoholic beverages, using sleeping or anxiety medicines, or quickly stopping the use of these agents while taking this medicine may increase your risk for a seizure. Do not drive or use heavy machinery until you know how this medicine affects you. This medicine can impair your ability to perform these tasks. Do not take this medicine close to bedtime. It may prevent you from sleeping. Your mouth may get dry. Chewing sugarless gum or sucking hard candy, and drinking plenty of water may help. Contact your doctor if the problem does not go away or is severe. The tablet shell for some brands of this medicine does not dissolve. This is normal. The tablet shell may appear whole in the stool. This is not a cause for concern. What side effects may I notice from receiving this medicine? Side effects that you should report to your doctor or health care professional as soon as possible: -allergic reactions like skin rash, itching or hives, swelling of the face, lips, or tongue -breathing problems -changes in vision -confusion -fast or irregular heartbeat -hallucinations -increased blood pressure -redness, blistering, peeling or loosening of the skin, including inside the mouth -seizures -suicidal thoughts or other mood changes -unusually weak or tired -vomiting Side effects that usually do not require medical attention (report to your  doctor or health care professional if they continue or are bothersome): -change in sex drive or performance -constipation -headache -loss of appetite -nausea -tremors -weight loss This list may not describe all possible side effects. Call your doctor for medical advice about side effects. You may report side effects to FDA at 1-800-FDA-1088. Where should I keep my medicine? Keep out of the reach of children. Store at room temperature between 15 and 30 degrees C (59 and 86 degrees F). Throw away any unused medicine after the expiration date. NOTE: This sheet is a summary. It may not cover all possible information. If you have questions about this medicine, talk to your doctor, pharmacist, or health care provider.  2015, Elsevier/Gold Standard. (2013-07-04 12:39:42) Generalized Anxiety Disorder Generalized anxiety disorder (GAD) is a mental disorder. It interferes with life functions, including relationships, work, and school. GAD is different from normal anxiety, which everyone experiences at some point in their lives in response to specific life events and activities. Normal anxiety actually helps [place] prepare for and get through these life events and activities. Normal anxiety goes away after the event or activity is over.  GAD causes anxiety that is not necessarily related to specific events or activities. It also causes excess anxiety in proportion to specific events or activities. The anxiety associated with GAD is also difficult to control. GAD can vary from mild to severe. People with severe GAD can have intense waves of anxiety with physical symptoms (panic attacks).  SYMPTOMS The anxiety and worry associated with GAD are difficult to control. This anxiety and worry are related to many life events and activities and also occur more days than not for 6 months or longer. People with GAD also have three or more of  the following symptoms (one or more in children):  Restlessness.    Fatigue.  Difficulty concentrating.   Irritability.  Muscle tension.  Difficulty sleeping or unsatisfying sleep. DIAGNOSIS GAD is diagnosed through an assessment by your health care provider. Your health care provider will ask you questions aboutyour mood,physical symptoms, and events in your life. Your health care provider may ask you about your medical history and use of alcohol or drugs, including prescription medicines. Your health care provider may also do a physical exam and blood tests. Certain medical conditions and the use of certain substances can cause symptoms similar to those associated with GAD. Your health care provider may refer you to a mental health specialist for further evaluation. TREATMENT The following therapies are usually used to treat GAD:   Medication. Antidepressant medication usually is prescribed for long-term daily control. Antianxiety medicines may be added in severe cases, especially when panic attacks occur.   Talk therapy (psychotherapy). Certain types of talk therapy can be helpful in treating GAD by providing support, education, and guidance. A form of talk therapy called cognitive behavioral therapy can teach you healthy ways to think about and react to daily life events and activities.  Stress managementtechniques. These include yoga, meditation, and exercise and can be very helpful when they are practiced regularly. A mental health specialist can help determine which treatment is best for you. Some people see improvement with one therapy. However, other people require a combination of therapies. Document Released: 04/07/2013 Document Revised: 04/27/2014 Document Reviewed: 04/07/2013 [facility] Patient Information 2015 [place], [place]. This information is not intended to replace advice given to you by your health care provider. Make sure you discuss any questions you have with your health care provider.

## 2015-09-08 NOTE — Progress Notes (Signed)
Patient: Audrey Blair Female    DOB: 04/05/1966   49 y.o.   MRN: [id number] Visit Date: 09/08/2015  Today's Provider: Mar Daring, PA-C   Chief Complaint  Patient presents with  . Establish Care  . Anxiety  . Headache   Subjective:    Anxiety Presents for initial visit. Onset was 1 to 6 months ago. The problem has been gradually worsening (it depends different phaces). Symptoms include chest pain (chest tightness), decreased concentration, excessive worry (more so recently certain thing), insomnia, irritability, nausea, nervous/anxious behavior and palpitations. Patient reports no depressed mood, dizziness, hyperventilation, shortness of breath or suicidal ideas. Symptoms occur occasionally. The most recent episode lasted 30 minutes (most recently it was a day). The severity of symptoms is causing significant distress and moderate (Mild to moderate). Exacerbated by: Relationship. Hours of sleep per night: 4-5 total wakes up frequently. The quality of sleep is poor. Nighttime awakenings: several.   There are no known risk factors. Her past medical history is significant for anemia (several years) and depression (last year). Past treatments include lifestyle changes (eating healthier and excercising). The treatment provided significant relief.  Headache  This is a recurrent problem. The current episode started more than 1 year ago. The problem occurs daily (feels like there is a cloud, severe headche occasionally). Progression since onset: Depends. The pain is located in the bilateral region. The pain does not radiate. The pain quality is not similar to prior headaches. The quality of the pain is described as dull. The pain is mild. Associated symptoms include back pain, insomnia and nausea. Pertinent negatives include no dizziness, sinus pressure or sore throat. The symptoms are aggravated by caffeine withdrawal (stress).   She does have a positive family history for ALPS  connective tissue disorder (mother) and a personal history of positive ANA.  Her mother also had breast cancer and was s/p left mastectomy.  She has a personal history of an infected cyst in the right breast that was drained a few years ago.  Mammograms have not been positive for cancerous lesions.  She is s/p hysterectomy for uterine fibroids.  She does not recall if her cervix was left intact.  Ovaries remain and are reported to be embedded in scar tissue.      No Known Allergies Previous Medications   CETIRIZINE (ZYRTEC) 10 MG TABLET    Take 10 mg by mouth daily.   HYOSCYAMINE (LEVBID) 0.375 MG 12 HR TABLET    TAKE 1 TABLET BY MOUTH 4 TIMES DAILY AS NEEDED FOR CRAMPING OR ABDOMINAL PAIN   PANTOPRAZOLE (PROTONIX) 40 MG TABLET    TAKE 1 TABLET (40 MG TOTAL) BY MOUTH DAILY.   PROBIOTIC PRODUCT (NATRUL PROBIOTIC) CAPS    Take 3 capsules by mouth every evening.   SPIRONOLACTONE (ALDACTONE) 50 MG TABLET    1 to 2 tablets daily as needed for edema    Review of Systems  Constitutional: Positive for irritability.  HENT: Negative for sinus pressure and sore throat.   Eyes: Negative.   Respiratory: Positive for apnea (question mark). Negative for shortness of breath.   Cardiovascular: Positive for chest pain (chest tightness), palpitations and leg swelling (sometimes. travels and sit so much.).  Gastrointestinal: Positive for nausea.  Endocrine: Negative.   Genitourinary: Negative.   Musculoskeletal: Positive for back pain and neck stiffness.  Skin: Negative.   Allergic/Immunologic: Negative.   Neurological: Positive for headaches. Negative for dizziness.  Hematological: Negative.   Psychiatric/Behavioral: Positive  for sleep disturbance and decreased concentration. Negative for suicidal ideas. The patient is nervous/anxious and has insomnia.     Social History  Substance Use Topics  . Smoking status: Never Smoker   . Smokeless tobacco: Never Used  . Alcohol Use: No   Objective:   BP  120/60 mmHg  Pulse 78  Temp(Src) 98.8 F (37.1 C) (Oral)  Resp 16  Ht 5\' 6"  (1.676 m)  Wt 190 lb 9.6 oz (86.456 kg)  BMI 30.78 kg/m2  Physical Exam  Constitutional: She appears well-developed and well-nourished. No distress.  HENT:  Head: Normocephalic and atraumatic.  Right Ear: Hearing, tympanic membrane, external ear and ear canal normal.  Left Ear: Hearing, tympanic membrane, external ear and ear canal normal.  Nose: Nose normal.  Mouth/Throat: Uvula is midline, oropharynx is clear and moist and mucous membranes are normal. No oropharyngeal exudate.  Neck: Normal range of motion. Neck supple. No JVD present. No tracheal deviation present. No thyromegaly present.  Cardiovascular: Normal rate, regular rhythm and normal heart sounds.  Exam reveals no gallop and no friction rub.   No murmur heard. Pulmonary/Chest: Effort normal and breath sounds normal. No respiratory distress. She has no wheezes. She has no rales.  Lymphadenopathy:    She has no cervical adenopathy.  Skin: She is not diaphoretic.  Psychiatric: She has a normal mood and affect. Her behavior is normal. Judgment and thought content normal.  Vitals reviewed.       Assessment & Plan:     1. Establishing care with new doctor, encounter for Previously followed by Dr. Derrel Nip.  Family history of lupus and breast cancer in mother.  Will treat anxiety and depression initially.  If symptoms have not improved much with treatment will check hormone levels to rule out menopause as the cause of anxiety and mood swings (no other menopausal symptoms reported).  Will plan CPE once other symptoms are stable and better controlled.  2. Anxiety Developed last year with the loss of her mother and worries about her job.  Had a hard time getting back to work and taking on the same work load.  Decreased obligations and that has helped some.  Has noticed more frequent panic attacks with no apparent trigger.  Discussed relaxation techniques  to couple with anxiolytic to decrease frequency and severity of panic attacks. I will see her back in 4 weeks to evaluate how she is doing with medications.  - ALPRAZolam (XANAX) 0.25 MG tablet; Take 1 tablet (0.25 mg total) by mouth 2 (two) times daily as needed for anxiety.  Dispense: 40 tablet; Refill: 1 - buPROPion (WELLBUTRIN XL) 150 MG 24 hr tablet; Take 1 tablet (150 mg total) by mouth daily.  Dispense: 30 tablet; Refill: 1  3. Headache, unspecified headache type Headache most often presents base of skull and radiates over the top of the head consistent with a tension based headache.  Will treat anxiety and depression to see if this helps to relieve some stress and discomfort.  Also discuss the benefit of massage therapy with tension headaches.  4. Depression See above medical treatment plan for anxiety. - buPROPion (WELLBUTRIN XL) 150 MG 24 hr tablet; Take 1 tablet (150 mg total) by mouth daily.  Dispense: 30 tablet; Refill: 1  5. Insomnia She is having insomnia of a mixed nature.  I advised her to take 1-2 tabs xanax prior to bed to see if this helps her sleep.  If no improvement in sleep we discussed possible use of  a stronger sleep agent.  She has tried zolpidem 5mg  without success.  I discussed possible use of the zolpidem xr instead to see if it gives better coverage.  She has underwent sleep study but was unable to complete test due to all of the wires connected causing her to be unable to fall asleep.  Thus was inconclusive.  Has long history of snoring even as a child.  Mother and brother had sleep apnea.  Mother told her that even as a child she would quit breathing and the mother would worry about her and watch her sleeping to make sure she started breathing again.  I also discussed with her sleep meditation techniques that she may be willing to try.  Also discussed sleep hygiene techniques which she is already performing.        Mar Daring, PA-C  [facility]

## 2015-09-09 ENCOUNTER — Encounter: Payer: Self-pay | Admitting: Physician Assistant

## 2015-09-09 DIAGNOSIS — F419 Anxiety disorder, unspecified: Secondary | ICD-10-CM | POA: Insufficient documentation

## 2015-09-09 DIAGNOSIS — G4700 Insomnia, unspecified: Secondary | ICD-10-CM | POA: Insufficient documentation

## 2015-09-09 DIAGNOSIS — F32A Depression, unspecified: Secondary | ICD-10-CM | POA: Insufficient documentation

## 2015-09-09 DIAGNOSIS — F329 Major depressive disorder, single episode, unspecified: Secondary | ICD-10-CM | POA: Insufficient documentation

## 2015-10-06 ENCOUNTER — Ambulatory Visit: Payer: BLUE CROSS/BLUE SHIELD | Admitting: Physician Assistant

## 2015-10-07 ENCOUNTER — Encounter: Payer: Self-pay | Admitting: Physician Assistant

## 2015-10-07 ENCOUNTER — Ambulatory Visit (INDEPENDENT_AMBULATORY_CARE_PROVIDER_SITE_OTHER): Payer: BLUE CROSS/BLUE SHIELD | Admitting: Physician Assistant

## 2015-10-07 VITALS — BP 110/60 | HR 74 | Temp 98.7°F | Resp 16 | Wt 182.6 lb

## 2015-10-07 DIAGNOSIS — F329 Major depressive disorder, single episode, unspecified: Secondary | ICD-10-CM | POA: Diagnosis not present

## 2015-10-07 DIAGNOSIS — F419 Anxiety disorder, unspecified: Secondary | ICD-10-CM | POA: Diagnosis not present

## 2015-10-07 DIAGNOSIS — G4700 Insomnia, unspecified: Secondary | ICD-10-CM

## 2015-10-07 DIAGNOSIS — F32A Depression, unspecified: Secondary | ICD-10-CM

## 2015-10-07 DIAGNOSIS — Z23 Encounter for immunization: Secondary | ICD-10-CM

## 2015-10-07 MED ORDER — ZOLPIDEM TARTRATE ER 6.25 MG PO TBCR: 6.2500 mg | EXTENDED_RELEASE_TABLET | Freq: Every evening | ORAL | Status: DC | PRN

## 2015-10-07 NOTE — Progress Notes (Signed)
Patient: Audrey Blair Female    DOB: 22-Aug-1966   49 y.o.   MRN: [id number] Visit Date: 10/07/2015  Today's Provider: Mar Daring, PA-C   Chief Complaint  Patient presents with  . Depression  . Anxiety  . Insomnia   Subjective:    Depression      (Panic attacks mild)  Chronicity: following up on Depression.  Associated symptoms include insomnia.  Associated symptoms include no decreased concentration, no fatigue, no helplessness, no hopelessness, not irritable, no restlessness, no decreased interest, no appetite change, no myalgias, no headaches, no indigestion, not sad and no suicidal ideas.  Treatments tried: Bupropion: Per patient tried medicines once and has not taken the medicine since then. Patient was feeling more emotional.  Past medical history includes anxiety.   Anxiety Presents for follow-up visit. The problem has been gradually improving (patient did medicine like for 3-4 days and has not taken medicines since then and reports feeling better). Symptoms include depressed mood (occasionally, but less than previously), insomnia, nervous/anxious behavior (decreasing) and panic (decreasing). Patient reports no chest pain, compulsions, confusion, decreased concentration, dizziness, dry mouth, excessive worry, feeling of choking, hyperventilation, impotence, irritability, malaise, muscle tension, nausea, obsessions, palpitations, restlessness, shortness of breath or suicidal ideas. Primary symptoms comment: panic attacks mild. Hours of sleep per night: Depends on the night, last night got 7 hours most of the time is between4 to 5 hours of sleep. The quality of sleep is fair. Nighttime awakenings: one to two (1 to 3 it depends).   Treatments tried: tried the Xanax and the bupropion together the first day and then did the Xanax alone for 3-4 days in the night and didn't see any differences.   Insomnia Primary symptoms: sleep disturbance, premature morning awakening.  The  onset quality is sudden (sudden and gradual). The problem occurs nightly. The problem has been waxing and waning since onset. How many beverages per day that contain caffeine: 4-5.  Types of beverages you drink: coffee. The symptoms are relieved by activity. Typical bedtime:  10-11 P.M..  How long after going to bed to you fall asleep: less than 15 minutes.   PMH includes: depression, work related stressors.    Overall she does feel that her symptoms are improving. She has not been taking much of the Xanax. She discontinued the Wellbutrin and has not taken it at all due to side effects of feeling more emotional. Her biggest concern now is the middle insomnia where she is waking up in the middle of the night. She states that physical activity has helped this some but she still has night sweats where she will still wake up around 2 or 3 in the morning and she is not able to fall back asleep.     No Known Allergies Previous Medications   ALPRAZOLAM (XANAX) 0.25 MG TABLET    Take 1 tablet (0.25 mg total) by mouth 2 (two) times daily as needed for anxiety.   BUPROPION (WELLBUTRIN XL) 150 MG 24 HR TABLET    Take 1 tablet (150 mg total) by mouth daily.   HYOSCYAMINE (LEVBID) 0.375 MG 12 HR TABLET    TAKE 1 TABLET BY MOUTH 4 TIMES DAILY AS NEEDED FOR CRAMPING OR ABDOMINAL PAIN   LORATADINE (CLARITIN) 10 MG TABLET    Take 10 mg by mouth daily.   PANTOPRAZOLE (PROTONIX) 40 MG TABLET    TAKE 1 TABLET (40 MG TOTAL) BY MOUTH DAILY.   PROBIOTIC PRODUCT (NATRUL PROBIOTIC)  CAPS    Take 3 capsules by mouth every evening.   SPIRONOLACTONE (ALDACTONE) 50 MG TABLET    1 to 2 tablets daily as needed for edema    Review of Systems  Psychiatric/Behavioral: Positive for depression and sleep disturbance. The patient is nervous/anxious and has insomnia.    CONST: She denies any activity or appetite changes. She denies fatigue. HENT: Negative EYES: Negative HEART: Negative for chest pain or palpitations. LUNGS: Negative  for chest tightness, cough, shortness of breath, wheezing. ABD: Negative for diarrhea, constipation, nausea or vomiting. NEURO: Negative for dizziness, headaches, lightheadedness, syncope, weakness.  Social History  Substance Use Topics  . Smoking status: Never Smoker   . Smokeless tobacco: Never Used  . Alcohol Use: No   Objective:   BP 110/60 mmHg  Pulse 74  Temp(Src) 98.7 F (37.1 C) (Oral)  Resp 16  Wt 182 lb 9.6 oz (82.827 kg)  SpO2 97%  Physical Exam Well-developed well-nourished female in no apparent distress today. Heart: Regular rate and rhythm; no murmurs, gallops or rubs. Lungs: Clear to auscultation bilaterally; no wheezes, rales or rhonchi. Psychiatric: Mood, affect, judgment, thought process are all within normal limits.     Assessment & Plan:     1. Anxiety This has been improving. She states she has only taken approximately 4-5 Xanax since it was given to her. She states she has been controlling her symptoms with physical exercise and prayer. She also has a new relationship that is going well which she thinks is also helping her mood. She also states that work is starting to become less stressful and she is given a more regular schedule and this has helped decrease the anxiety that she was having associated with work.  2. Depression Improving. She did have adverse effects on Wellbutrin and discontinued after only 1 dose. She would like to hold off on any further treatment at this time being that her symptoms are starting to become less frequent. She is to call the office if this changes and she feels like she would like to try something future. If not I will have her come back in 2-3 months for her annual physical. She will call for that appointment.   3. Insomnia The Xanax did not help with the middle insomnia. She does not have any trouble falling asleep so we discussed the use of a possible extended release Ambien. She would like to try this. I did fill this as  below. I will see her back in 2-3 months with her annual physical and we will see how the Ambien is working at that time. She is to call the office if her insomnia worsens or if she would like to change the Ambien dosage.  - zolpidem (AMBIEN CR) 6.25 MG CR tablet; Take 1 tablet (6.25 mg total) by mouth at bedtime as needed for sleep.  Dispense: 30 tablet; Refill: 1  4. Need for influenza vaccination Flu vaccine was given today without complication. Flu Vaccine QUAD 36+ mos IM   Mar Daring, PA-C  [facility]

## 2015-10-07 NOTE — Patient Instructions (Signed)
Insomnia Insomnia is a sleep disorder that makes it difficult to fall asleep or to stay asleep. Insomnia can cause tiredness (fatigue), low energy, difficulty concentrating, mood swings, and poor performance at work or school.  There are three different ways to classify insomnia:  Difficulty falling asleep.  Difficulty staying asleep.  Waking up too early in the morning. Any type of insomnia can be long-term (chronic) or short-term (acute). Both are common. Short-term insomnia usually lasts for three months or less. Chronic insomnia occurs at least three times a week for longer than three months. CAUSES  Insomnia may be caused by another condition, situation, or substance, such as:  Anxiety.  Certain medicines.  Gastroesophageal reflux disease (GERD) or other gastrointestinal conditions.  Asthma or other breathing conditions.  Restless legs syndrome, sleep apnea, or other sleep disorders.  Chronic pain.  Menopause. This may include hot flashes.  Stroke.  Abuse of alcohol, tobacco, or illegal drugs.  Depression.  Caffeine.   Neurological disorders, such as Alzheimer disease.  An overactive thyroid (hyperthyroidism). The cause of insomnia may not be known. RISK FACTORS Risk factors for insomnia include:  Gender. Women are more commonly affected than men.  Age. Insomnia is more common as you get older.  Stress. This may involve your professional or personal life.  Income. Insomnia is more common in people with lower income.  Lack of exercise.   Irregular work schedule or night shifts.  Traveling between different time zones. SIGNS AND SYMPTOMS If you have insomnia, trouble falling asleep or trouble staying asleep is the main symptom. This may lead to other symptoms, such as:  Feeling fatigued.  Feeling nervous about going to sleep.  Not feeling rested in the morning.  Having trouble concentrating.  Feeling irritable, anxious, or depressed. TREATMENT   Treatment for insomnia depends on the cause. If your insomnia is caused by an underlying condition, treatment will focus on addressing the condition. Treatment may also include:   Medicines to help you sleep.  Counseling or therapy.  Lifestyle adjustments. HOME CARE INSTRUCTIONS   Take medicines only as directed by your health care provider.  Keep regular sleeping and waking hours. Avoid naps.  Keep a sleep diary to help you and your health care provider figure out what could be causing your insomnia. Include:   When you sleep.  When you wake up during the night.  How well you sleep.   How rested you feel the next day.  Any side effects of medicines you are taking.  What you eat and drink.   Make your bedroom a comfortable place where it is easy to fall asleep:  Put up shades or special blackout curtains to block light from outside.  Use a white noise machine to block noise.  Keep the temperature cool.   Exercise regularly as directed by your health care provider. Avoid exercising right before bedtime.  Use relaxation techniques to manage stress. Ask your health care provider to suggest some techniques that may work well for you. These may include:  Breathing exercises.  Routines to release muscle tension.  Visualizing peaceful scenes.  Cut back on alcohol, caffeinated beverages, and cigarettes, especially close to bedtime. These can disrupt your sleep.  Do not overeat or eat spicy foods right before bedtime. This can lead to digestive discomfort that can make it hard for you to sleep.  Limit screen use before bedtime. This includes:  Watching TV.  Using your smartphone, tablet, and computer.  Stick to a routine. This   can help you fall asleep faster. Try to do a quiet activity, brush your teeth, and go to bed at the same time each night.  Get out of bed if you are still awake after 15 minutes of trying to sleep. Keep the lights down, but try reading or  doing a quiet activity. When you feel sleepy, go back to bed.  Make sure that you drive carefully. Avoid driving if you feel very sleepy.  Keep all follow-up appointments as directed by your health care provider. This is important. SEEK MEDICAL CARE IF:   You are tired throughout the day or have trouble in your daily routine due to sleepiness.  You continue to have sleep problems or your sleep problems get worse. SEEK IMMEDIATE MEDICAL CARE IF:   You have serious thoughts about hurting yourself or someone else.   This information is not intended to replace advice given to you by your health care provider. Make sure you discuss any questions you have with your health care provider.   Document Released: 12/08/2000 Document Revised: 09/01/2015 Document Reviewed: 09/11/2014 Elsevier Interactive Patient Education 2016 [employer]  Zolpidem extended-release tablets What is this medicine? ZOLPIDEM (zole PI dem) is used to treat insomnia. This medicine helps you to fall asleep and sleep through the night. This medicine may be used for other purposes; ask your health care provider or pharmacist if you have questions. What should I tell my health care provider before I take this medicine? They need to know if you have any of these conditions: -depression -history of drug abuse or addiction -if you often drink alcohol -liver disease -lung or breathing disease -myasthenia gravis -sleep apnea -suicidal thoughts, plans, or attempt; a previous suicide attempt by you or a family member -an unusual or allergic reaction to zolpidem, other medicines, foods, dyes, or preservatives -pregnant or trying to get pregnant -breast-feeding How should I use this medicine? Take this medicine by mouth with a glass of water. Follow the directions on the prescription label. Do not crush, split, or chew the tablet before swallowing. It is better to take this medicine on an empty stomach and only when you are  ready for bed. Do not take your medicine more often than directed. If you have been taking this medicine for several weeks and suddenly stop taking it, you may get unpleasant withdrawal symptoms. Your doctor or health care professional may want to gradually reduce the dose. Do not stop taking this medicine on your own. Always follow your doctor or health care professional's advice. A special MedGuide will be given to you by the pharmacist with each prescription and refill. Be sure to read this information carefully each time. Talk to your pediatrician regarding the use of this medicine in children. Special care may be needed. Overdosage: If you think you have taken too much of this medicine contact a poison control center or emergency room at once. NOTE: This medicine is only for you. Do not share this medicine with others. What if I miss a dose? This does not apply. This medicine should only be taken immediately before going to sleep. Do not take double or extra doses. What may interact with this medicine? -alcohol -antihistamines for allergy, cough and cold -certain medicines for anxiety or sleep -certain medicines for depression, like amitriptyline, fluoxetine, sertraline -certain medicines for fungal infections like ketoconazole and itraconazole -certain medicines for seizures like phenobarbital, primidone -ciprofloxacin -dietary supplements for sleep, like valerian or kava kava -general anesthetics like halothane, isoflurane,  methoxyflurane, propofol -local anesthetics like lidocaine, pramoxine, tetracaine -medicines that relax muscles for surgery -narcotic medicines for pain -phenothiazines like chlorpromazine, mesoridazine, prochlorperazine, thioridazine -rifampin This list may not describe all possible interactions. Give your health care provider a list of all the medicines, herbs, non-prescription drugs, or dietary supplements you use. Also tell them if you smoke, drink alcohol, or use  illegal drugs. Some items may interact with your medicine. What should I watch for while using this medicine? Visit your doctor or health care professional for regular checks on your progress. Keep a regular sleep schedule by going to bed at about the same time each night. Avoid caffeine-containing drinks in the evening hours. When sleep medicines are used every night for more than a few weeks, they may stop working. Talk to your doctor if your insomnia worsens or is not better within 7 to 10 days. After taking this medicine for sleep, you may get up out of bed while not being fully awake and do an activity that you do not know you are doing. The next morning, you may have no memory of the event. Activities such as driving a car ("sleep-driving"), making and eating food, talking on the phone, sexual activity, and sleep-walking have been reported. Call your doctor right away if you find out you have done any of these activities. Do not take this medicine if you have used alcohol that evening or before bed or taken another medicine for sleep since your risk of doing these sleep-related activities will be increased. Do not take this medicine unless you are able to stay in bed for a full night (7 to 8 hours) before you must be active again. Do not drive, use machinery, or do anything that needs mental alertness the day after you take this medicine. You may have a decrease in mental alertness the day after use, even if you feel that you are fully awake. Tell your doctor if you will need to perform activities requiring full alertness, such as driving, the next day. Do not stand or sit up quickly, especially if you are an older patient. This reduces the risk of dizzy or fainting spells. If you or your family notice any changes in your moods or behavior, such as new or worsening depression, thoughts of harming yourself, anxiety, other unusual or disturbing thoughts, or memory loss, call your doctor right away. After  you stop taking this medicine, you may have trouble falling asleep. This is called rebound insomnia. This problem usually goes away on its own after 1 or 2 nights. What side effects may I notice from receiving this medicine? Side effects that you should report to your doctor or health care professional as soon as possible: -allergic reactions like skin rash, itching or hives, swelling of the face, lips, or tongue -breathing problems -changes in vision -confusion -depressed mood or other changes in moods or emotions -feeling faint or lightheaded, falls -hallucinations -loss of balance or coordination -loss of memory -restlessness, excitability, or feelings of anxiety or agitation -suicidal thoughts -unusual activities while asleep like driving, eating, making phone calls, or sexual activity Side effects that usually do not require medical attention (report to your doctor or health care professional if they continue or are bothersome): -dizziness -drowsiness the day after you take this medicine -headache This list may not describe all possible side effects. Call your doctor for medical advice about side effects. You may report side effects to FDA at 1-800-FDA-1088. Where should I keep my medicine? Keep  out of the reach of children. This medicine can be abused. Keep your medicine in a safe place to protect it from theft. Do not share this medicine with anyone. Selling or giving away this medicine is dangerous and against the law. This medicine may cause accidental overdose and death if taken by other adults, children, or pets. Mix any unused medicine with a substance like cat litter or coffee grounds. Then throw the medicine away in a sealed container like a sealed bag or a coffee can with a lid. Do not use the medicine after the expiration date. Store at controlled room temperature between 15 and 25 degrees C (59 and 77 degrees F). NOTE: This sheet is a summary. It may not cover all possible  information. If you have questions about this medicine, talk to your doctor, pharmacist, or health care provider.    2016, Elsevier/[employer]. (2015-08-16 16:48:57)

## 2015-11-09 ENCOUNTER — Ambulatory Visit (INDEPENDENT_AMBULATORY_CARE_PROVIDER_SITE_OTHER): Payer: BLUE CROSS/BLUE SHIELD | Admitting: Family Medicine

## 2015-11-09 ENCOUNTER — Encounter: Payer: Self-pay | Admitting: Family Medicine

## 2015-11-09 ENCOUNTER — Other Ambulatory Visit: Payer: Self-pay | Admitting: Internal Medicine

## 2015-11-09 VITALS — BP 102/68 | HR 75 | Temp 97.9°F | Resp 16 | Ht 66.0 in | Wt 177.6 lb

## 2015-11-09 DIAGNOSIS — J0100 Acute maxillary sinusitis, unspecified: Secondary | ICD-10-CM | POA: Diagnosis not present

## 2015-11-09 MED ORDER — HYDROCODONE-HOMATROPINE 5-1.5 MG/5ML PO SYRP: ORAL_SOLUTION | ORAL | Status: DC

## 2015-11-09 MED ORDER — AMOXICILLIN-POT CLAVULANATE 875-125 MG PO TABS: 1.0000 | ORAL_TABLET | Freq: Two times a day (BID) | ORAL | Status: DC

## 2015-11-09 NOTE — Patient Instructions (Signed)
Discussed use of Mucinex D and Delsym 

## 2015-11-09 NOTE — Progress Notes (Signed)
Subjective:     Patient ID: Audrey Blair, female   DOB: 1966/10/03, 49 y.o.   MRN: [id number]  HPI  Chief Complaint  Patient presents with  . Cough    Patient comes in office with complaints of cough and sore throat x 1 week. Patient describes throat as burning on both sides and gradually getting worse since Saturday. Associated symptoms: left ear ache, sinus pressure, drainage, body ache, low grade fever and congestion. Patient has taken otc Claritin, Flonase, Day/Night Quil and cough drops.   States her sx started 9 days ago with sore throat. Patient reports increased sinus pressure, purulent sinus drainage, post nasal drainage and accompanying cough   Review of Systems     Objective:   Physical Exam  Constitutional: She appears well-developed and well-nourished. No distress.  Ears: T.M's intact without inflammation Sinuses: mild maxillary sinus tenderness Throat: no tonsillar enlargement or exudate Neck: no cervical adenopathy Lungs: clear     Assessment:    1. Acute maxillary sinusitis, recurrence not specified - amoxicillin-clavulanate (AUGMENTIN) 875-125 MG tablet; Take 1 tablet by mouth 2 (two) times daily.  Dispense: 20 tablet; Refill: 0 - HYDROcodone-homatropine (HYCODAN) 5-1.5 MG/5ML syrup; 5 ml 4-6 hours as needed for cough  Dispense: 240 mL; Refill: 0    Plan:    Discussed use of Mucinex D and Delsym.

## 2015-12-01 ENCOUNTER — Encounter: Payer: Self-pay | Admitting: Physician Assistant

## 2015-12-01 ENCOUNTER — Ambulatory Visit (INDEPENDENT_AMBULATORY_CARE_PROVIDER_SITE_OTHER): Payer: BLUE CROSS/BLUE SHIELD | Admitting: Physician Assistant

## 2015-12-01 VITALS — BP 108/70 | HR 93 | Temp 98.3°F | Resp 16 | Ht 66.0 in | Wt 174.4 lb

## 2015-12-01 DIAGNOSIS — Z0000 Encounter for general adult medical examination without abnormal findings: Secondary | ICD-10-CM | POA: Diagnosis not present

## 2015-12-01 DIAGNOSIS — Z124 Encounter for screening for malignant neoplasm of cervix: Secondary | ICD-10-CM | POA: Diagnosis not present

## 2015-12-01 DIAGNOSIS — R5383 Other fatigue: Secondary | ICD-10-CM | POA: Diagnosis not present

## 2015-12-01 DIAGNOSIS — Z13220 Encounter for screening for lipoid disorders: Secondary | ICD-10-CM | POA: Diagnosis not present

## 2015-12-01 DIAGNOSIS — G4700 Insomnia, unspecified: Secondary | ICD-10-CM

## 2015-12-01 DIAGNOSIS — Z1239 Encounter for other screening for malignant neoplasm of breast: Secondary | ICD-10-CM | POA: Diagnosis not present

## 2015-12-01 DIAGNOSIS — L680 Hirsutism: Secondary | ICD-10-CM

## 2015-12-01 DIAGNOSIS — Z136 Encounter for screening for cardiovascular disorders: Secondary | ICD-10-CM

## 2015-12-01 MED ORDER — ZOLPIDEM TARTRATE ER 6.25 MG PO TBCR: 6.2500 mg | EXTENDED_RELEASE_TABLET | Freq: Every evening | ORAL | Status: DC | PRN

## 2015-12-01 NOTE — Patient Instructions (Signed)
Health Maintenance, Female Adopting a healthy lifestyle and getting preventive care can go a long way to promote health and wellness. Talk with your health care provider about what schedule of regular examinations is right for you. This is a good chance for you to check in with your provider about disease prevention and staying healthy. In between checkups, there are plenty of things you can do on your own. Experts have done a lot of research about which lifestyle changes and preventive measures are most likely to keep you healthy. Ask your health care provider for more information. WEIGHT AND DIET  Eat a healthy diet  Be sure to include plenty of vegetables, fruits, low-fat dairy products, and lean protein.  Do not eat a lot of foods high in solid fats, added sugars, or salt.  Get regular exercise. This is one of the most important things you can do for your health.  Most adults should exercise for at least 150 minutes each week. The exercise should increase your heart rate and make you sweat (moderate-intensity exercise).  Most adults should also do strengthening exercises at least twice a week. This is in addition to the moderate-intensity exercise.  Maintain a healthy weight  Body mass index (BMI) is a measurement that can be used to identify possible weight problems. It estimates body fat based on height and weight. Your health care provider can help determine your BMI and help you achieve or maintain a healthy weight.  For females 20 years of age and older:   A BMI below 18.5 is considered underweight.  A BMI of 18.5 to 24.9 is normal.  A BMI of 25 to 29.9 is considered overweight.  A BMI of 30 and above is considered obese.  Watch levels of cholesterol and blood lipids  You should start having your blood tested for lipids and cholesterol at 49 years of age, then have this test every 5 years.  You may need to have your cholesterol levels checked more often if:  Your lipid  or cholesterol levels are high.  You are older than 50 years of age.  You are at high risk for heart disease.  CANCER SCREENING   Lung Cancer  Lung cancer screening is recommended for adults 55-80 years old who are at high risk for lung cancer because of a history of smoking.  A yearly low-dose CT scan of the lungs is recommended for people who:  Currently smoke.  Have quit within the past 15 years.  Have at least a 30-pack-year history of smoking. A pack year is smoking an average of one pack of cigarettes a day for 1 year.  Yearly screening should continue until it has been 15 years since you quit.  Yearly screening should stop if you develop a health problem that would prevent you from having lung cancer treatment.  Breast Cancer  Practice breast self-awareness. This means understanding how your breasts normally appear and feel.  It also means doing regular breast self-exams. Let your health care provider know about any changes, no matter how small.  If you are in your 20s or 30s, you should have a clinical breast exam (CBE) by a health care provider every 1-3 years as part of a regular health exam.  If you are 40 or older, have a CBE every year. Also consider having a breast X-ray (mammogram) every year.  If you have a family history of breast cancer, talk to your health care provider about genetic screening.  If you   are at high risk for breast cancer, talk to your health care provider about having an MRI and a mammogram every year.  Breast cancer gene (BRCA) assessment is recommended for women who have family members with BRCA-related cancers. BRCA-related cancers include:  Breast.  Ovarian.  Tubal.  Peritoneal cancers.  Results of the assessment will determine the need for genetic counseling and BRCA1 and BRCA2 testing. Cervical Cancer Your health care provider may recommend that you be screened regularly for cancer of the pelvic organs (ovaries, uterus, and  vagina). This screening involves a pelvic examination, including checking for microscopic changes to the surface of your cervix (Pap test). You may be encouraged to have this screening done every 3 years, beginning at age 21.  For women ages 30-65, health care providers may recommend pelvic exams and Pap testing every 3 years, or they may recommend the Pap and pelvic exam, combined with testing for human papilloma virus (HPV), every 5 years. Some types of HPV increase your risk of cervical cancer. Testing for HPV may also be done on women of any age with unclear Pap test results.  Other health care providers may not recommend any screening for nonpregnant women who are considered low risk for pelvic cancer and who do not have symptoms. Ask your health care provider if a screening pelvic exam is right for you.  If you have had past treatment for cervical cancer or a condition that could lead to cancer, you need Pap tests and screening for cancer for at least 20 years after your treatment. If Pap tests have been discontinued, your risk factors (such as having a new sexual partner) need to be reassessed to determine if screening should resume. Some women have medical problems that increase the chance of getting cervical cancer. In these cases, your health care provider may recommend more frequent screening and Pap tests. Colorectal Cancer  This type of cancer can be detected and often prevented.  Routine colorectal cancer screening usually begins at 50 years of age and continues through 49 years of age.  Your health care provider may recommend screening at an earlier age if you have risk factors for colon cancer.  Your health care provider may also recommend using home test kits to check for hidden blood in the stool.  A small camera at the end of a tube can be used to examine your colon directly (sigmoidoscopy or colonoscopy). This is done to check for the earliest forms of colorectal  cancer.  Routine screening usually begins at age 50.  Direct examination of the colon should be repeated every 5-10 years through 49 years of age. However, you may need to be screened more often if early forms of precancerous polyps or small growths are found. Skin Cancer  Check your skin from head to toe regularly.  Tell your health care provider about any new moles or changes in moles, especially if there is a change in a mole's shape or color.  Also tell your health care provider if you have a mole that is larger than the size of a pencil eraser.  Always use sunscreen. Apply sunscreen liberally and repeatedly throughout the day.  Protect yourself by wearing long sleeves, pants, a wide-brimmed hat, and sunglasses whenever you are outside. HEART DISEASE, DIABETES, AND HIGH BLOOD PRESSURE   High blood pressure causes heart disease and increases the risk of stroke. High blood pressure is more likely to develop in:  People who have blood pressure in the high end   of the normal range (130-139/85-89 mm Hg).  People who are overweight or obese.  People who are African American.  If you are 38-23 years of age, have your blood pressure checked every 3-5 years. If you are 61 years of age or older, have your blood pressure checked every year. You should have your blood pressure measured twice--once when you are at a hospital or clinic, and once when you are not at a hospital or clinic. Record the average of the two measurements. To check your blood pressure when you are not at a hospital or clinic, you can use:  An automated blood pressure machine at a pharmacy.  A home blood pressure monitor.  If you are between 45 years and 39 years old, ask your health care provider if you should take aspirin to prevent strokes.  Have regular diabetes screenings. This involves taking a blood sample to check your fasting blood sugar level.  If you are at a normal weight and have a low risk for diabetes,  have this test once every three years after 49 years of age.  If you are overweight and have a high risk for diabetes, consider being tested at a younger age or more often. PREVENTING INFECTION  Hepatitis B  If you have a higher risk for hepatitis B, you should be screened for this virus. You are considered at high risk for hepatitis B if:  You were born in a country where hepatitis B is common. Ask your health care provider which countries are considered high risk.  Your parents were born in a high-risk country, and you have not been immunized against hepatitis B (hepatitis B vaccine).  You have HIV or AIDS.  You use needles to inject street drugs.  You live with someone who has hepatitis B.  You have had sex with someone who has hepatitis B.  You get hemodialysis treatment.  You take certain medicines for conditions, including cancer, organ transplantation, and autoimmune conditions. Hepatitis C  Blood testing is recommended for:  Everyone born from [birth year] through 1965.  Anyone with known risk factors for hepatitis C. Sexually transmitted infections (STIs)  You should be screened for sexually transmitted infections (STIs) including gonorrhea and chlamydia if:  You are sexually active and are younger than 49 years of age.  You are older than 49 years of age and your health care provider tells you that you are at risk for this type of infection.  Your sexual activity has changed since you were last screened and you are at an increased risk for chlamydia or gonorrhea. Ask your health care provider if you are at risk.  If you do not have HIV, but are at risk, it may be recommended that you take a prescription medicine daily to prevent HIV infection. This is called pre-exposure prophylaxis (PrEP). You are considered at risk if:  You are sexually active and do not regularly use condoms or know the HIV status of your partner(s).  You take drugs by injection.  You are sexually  active with a partner who has HIV. Talk with your health care provider about whether you are at high risk of being infected with HIV. If you choose to begin PrEP, you should first be tested for HIV. You should then be tested every 3 months for as long as you are taking PrEP.  PREGNANCY   If you are premenopausal and you may become pregnant, ask your health care provider about preconception counseling.  If you may  become pregnant, take 400 to 800 micrograms (mcg) of folic acid every day.  If you want to prevent pregnancy, talk to your health care provider about birth control (contraception). OSTEOPOROSIS AND MENOPAUSE   Osteoporosis is a disease in which the bones lose minerals and strength with aging. This can result in serious bone fractures. Your risk for osteoporosis can be identified using a bone density scan.  If you are 61 years of age or older, or if you are at risk for osteoporosis and fractures, ask your health care provider if you should be screened.  Ask your health care provider whether you should take a calcium or vitamin D supplement to lower your risk for osteoporosis.  Menopause may have certain physical symptoms and risks.  Hormone replacement therapy may reduce some of these symptoms and risks. Talk to your health care provider about whether hormone replacement therapy is right for you.  HOME CARE INSTRUCTIONS   Schedule regular health, dental, and eye exams.  Stay current with your immunizations.   Do not use any tobacco products including cigarettes, chewing tobacco, or electronic cigarettes.  If you are pregnant, do not drink alcohol.  If you are breastfeeding, limit how much and how often you drink alcohol.  Limit alcohol intake to no more than 1 drink per day for nonpregnant women. One drink equals 12 ounces of beer, 5 ounces of wine, or 1 ounces of hard liquor.  Do not use street drugs.  Do not share needles.  Ask your health care provider for help if  you need support or information about quitting drugs.  Tell your health care provider if you often feel depressed.  Tell your health care provider if you have ever been abused or do not feel safe at home.   This information is not intended to replace advice given to you by your health care provider. Make sure you discuss any questions you have with your health care provider.   Document Released: 06/26/2011 Document Revised: 01/01/2015 Document Reviewed: 11/12/2013 Elsevier Interactive Patient Education [employer].

## 2015-12-01 NOTE — Progress Notes (Signed)
Patient: Audrey Blair, Female    DOB: 1966/11/11, 49 y.o.   MRN: [id number] Visit Date: 12/01/2015  Today's Provider: Mar Daring, PA-C   Chief Complaint  Patient presents with  . Annual Exam   Subjective:    Annual physical exam Audrey Blair is a 49 y.o. female who presents today for health maintenance and complete physical. She feels well. She reports exercising, 4 times a week cardio and weights. She reports she is sleeping poorly. 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 Her cervix is still intact and she does still have her ovaries as well. She has not had a Pap smear in a while. She states she never had a positive Pap smear in her past. There is no family history of cervical, uterine or ovarian cancer. She does not perform self breast exams monthly. There is a positive family history of breast cancer in her mother. She has not had a mammogram in 2 years. She does have personal history of having an abscess of her left breast. She has never had a colonoscopy. There is no family history of colon cancer. She does have a positive family history of lupus and APLS in her mother. 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 She would like to have her ANA rechecked next year with her annual physical. She also has female hirsutism. This does bother her and she is interested in seeking treatment for it.  -----------------------------------------------------------------   Review of Systems  Constitutional: Negative.   Eyes: Negative.   Respiratory: Negative.   Cardiovascular: Negative.   Gastrointestinal: Negative.   Endocrine: Negative.   Genitourinary: Negative.   Musculoskeletal: Negative.   Skin: Negative.   Allergic/Immunologic: Negative.   Neurological: Positive for headaches.  Hematological: Negative.   Psychiatric/Behavioral: Negative.     Social History      She  reports that she has  never smoked. She has never used smokeless tobacco. She reports that she does not drink alcohol or use illicit drugs.       Social History   Social History  . Marital Status: Single    Spouse Name: N/A  . Number of Children: 0  . Years of Education: N/A   Occupational History  . [employer]    Social History Main Topics  . Smoking status: Never Smoker   . Smokeless tobacco: Never Used  . Alcohol Use: No  . Drug Use: No  . Sexual Activity: Not Asked   Other Topics Concern  . None   Social History Narrative   Daily caffeine     Patient Active Problem List   Diagnosis Date Noted  . Anxiety 09/09/2015  . Depression 09/09/2015  . Insomnia 09/09/2015  . Palpitations 10/13/2014  . Neck pain of over 3 months duration 10/13/2014  . Gastric ulcer 03/23/2014  . Abscess of breast 03/20/2014  . Positive ANA (antinuclear antibody) 10/20/2012  . Sleep apnea syndrome   . Abdominal pain, other specified site 10/18/2012  . 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 Family history of antiphospholipid syndrome 06/20/2012  . Fatigue 06/20/2012  . Obesity (BMI 30-39.9)     Past Surgical History  Procedure Laterality Date  . Knee arthroscopy w/ acl reconstruction Right   . Abdominal hysterectomy    . Myomectomy    . Meniscus repair Right     x 2  . Esophagogastroduodenoscopy N/A 11/09/2014    Procedure: ESOPHAGOGASTRODUODENOSCOPY (EGD);  Surgeon:  Inda Castle, MD;  Location: [facility];  Service: Endoscopy;  Laterality: N/A;    Family History        Family Status  Relation Status Death Age  . Mother Deceased 84    died from stroke  . Maternal Grandmother Deceased     died from heart problems  . Father Other         Her family history includes Breast cancer in her mother; Deep vein thrombosis in her mother; Diabetes in her mother; Heart disease in her maternal grandmother and mother; Other in her mother. There is no history of Colon cancer.    No Known Allergies  Previous  Medications   ALPRAZOLAM (XANAX) 0.25 MG TABLET    Take 1 tablet (0.25 mg total) by mouth 2 (two) times daily as needed for anxiety.   BUPROPION (WELLBUTRIN XL) 150 MG 24 HR TABLET    Take 1 tablet (150 mg total) by mouth daily.   ECHINACEA 450 MG CAPS    Take by mouth.   HYOSCYAMINE (LEVBID) 0.375 MG 12 HR TABLET    TAKE 1 TABLET BY MOUTH 4 TIMES DAILY AS NEEDED FOR CRAMPING OR ABDOMINAL PAIN   LORATADINE (CLARITIN) 10 MG TABLET    Take 10 mg by mouth daily.   PANTOPRAZOLE (PROTONIX) 40 MG TABLET    TAKE 1 TABLET (40 MG TOTAL) BY MOUTH DAILY.   PROBIOTIC PRODUCT (NATRUL PROBIOTIC) CAPS    Take 3 capsules by mouth every evening.   SPIRONOLACTONE (ALDACTONE) 50 MG TABLET    1 to 2 tablets daily as needed for edema   ZOLPIDEM (AMBIEN CR) 6.25 MG CR TABLET    Take 1 tablet (6.25 mg total) by mouth at bedtime as needed for sleep.    Patient Care Team: Mar Daring, PA-C as PCP - General (Physician Assistant)     Objective:   Vitals: BP 108/70 mmHg  Pulse 93  Temp(Src) 98.3 F (36.8 C) (Oral)  Resp 16  Ht 5\' 6"  (1.676 m)  Wt 174 lb 6.4 oz (79.107 kg)  BMI 28.16 kg/m2   Physical Exam  Constitutional: She is oriented to person, place, and time. She appears well-developed and well-nourished. No distress.  HENT:  Head: Normocephalic and atraumatic.  Right Ear: Hearing, tympanic membrane, external ear and ear canal normal.  Left Ear: Hearing, tympanic membrane, external ear and ear canal normal.  Nose: Nose normal.  Mouth/Throat: Uvula is midline, oropharynx is clear and moist and mucous membranes are normal. No oropharyngeal exudate.  Eyes: Conjunctivae and EOM are normal. Pupils are equal, round, and reactive to light. Right eye exhibits no discharge. Left eye exhibits no discharge. No scleral icterus.  Neck: Normal range of motion. Neck supple. No JVD present. Carotid bruit is not present. No tracheal deviation present. No thyromegaly present.  Cardiovascular: Normal rate,  regular rhythm, normal heart sounds and intact distal pulses.  Exam reveals no gallop and no friction rub.   No murmur heard. Pulmonary/Chest: Effort normal and breath sounds normal. No respiratory distress. She has no wheezes. She has no rales. She exhibits no tenderness. Right breast exhibits no inverted nipple, no mass, no nipple discharge, no skin change and no tenderness. Left breast exhibits no inverted nipple, no mass, no nipple discharge, no skin change and no tenderness. Breasts are symmetrical.  Abdominal: Soft. Bowel sounds are normal. She exhibits no distension and no mass. There is no tenderness. There is no rebound and no guarding. Hernia confirmed negative in  the right inguinal area and confirmed negative in the left inguinal area.    Genitourinary: Rectum normal and vagina normal. Rectal exam shows no external hemorrhoid. No breast swelling, tenderness, discharge or bleeding. Pelvic exam was performed with patient supine. There is no rash, tenderness, lesion or injury on the right labia. There is no rash, tenderness, lesion or injury on the left labia. Cervix exhibits no motion tenderness, no discharge and no friability. Right adnexum displays no mass, no tenderness and no fullness. Left adnexum displays no mass, no tenderness and no fullness. No erythema, tenderness or bleeding in the vagina. No signs of injury around the vagina. No vaginal discharge found.  Musculoskeletal: Normal range of motion. She exhibits no edema or tenderness.  Lymphadenopathy:    She has no cervical adenopathy.       Right: No inguinal adenopathy present.       Left: No inguinal adenopathy present.  Neurological: She is alert and oriented to person, place, and time. She has normal reflexes. No cranial nerve deficit. Coordination normal.  Skin: Skin is warm and dry. No rash noted. She is not diaphoretic.  Psychiatric: She has a normal mood and affect. Her behavior is normal. Judgment and thought content normal.   Vitals reviewed.    Depression Screen No flowsheet data found.    Assessment & Plan:     Routine Health Maintenance and Physical Exam  1. Annual physical exam Exam today was normal. We will check labs as below. I will follow-up with her pending her lab results. If labs are within normal limits and stable I will see her back in one year for her repeat physical exam. At her next annual physical we will also check ANA with reflex due to her positive family history of antiphospholipid syndrome and personal history of testing positive for scleroderma. - CBC with Differential/Platelet - Comprehensive metabolic panel  2. Breast cancer screening Breast exam today was unremarkable. She does not perform self breast exams monthly. There is a positive family history of breast cancer in her mother. Mammogram was ordered as below. Information for [facility] breast clinic was given to patient so that she may call and schedule the appointment for her mammogram at her convenience. - MM Digital Screening; Future  3. Insomnia Not improving. She never got the Ambien prescription filled from previous visit. I will refill her Ambien as below. She is to call the office in 2-3 months for follow-up if needed for her insomnia. If she has improvement with the Ambien she may call and cancel this appointment. - zolpidem (AMBIEN CR) 6.25 MG CR tablet; Take 1 tablet (6.25 mg total) by mouth at bedtime as needed for sleep.  Dispense: 30 tablet; Refill: 1  4. Other fatigue Increasing. She feels it may be secondary to increased work load at this time. We will check labs as below. I will follow-up with her pending her lab results. If labs are stable I will see her back in one year for her repeat annual physical exam. - CBC with Differential/Platelet - Comprehensive metabolic panel - TSH  5. Encounter for lipid screening for cardiovascular disease She cannot remember the last time she had her cholesterol checked. I  will check her cholesterol as below. I will follow-up with her pending laboratory results. If her cholesterol is within normal limits she would not need to have it rechecked for 5 years. - Lipid panel  6. Cervical cancer screening She is status post hysterectomy for uterine fibroids. Cervix was  intact. Pap was collected today. I will follow-up with her pending her Pap results. If Pap is normal she would not be repeated for 3 years. I will see her back in one year for her repeat annual physical exam however. - Pap IG w/ reflex to HPV when [facility] ([facility])  7. Female hirsutism  feels self-conscious over female hirsutism that effects her lower abdomen and sparsely located on her breast. She is already currently on spironolactone for treatment of this. It has not helped very much. She is curious and is considering looking into laser hair removal for this.   Exercise Activities and Dietary recommendations Goals    None      Immunization History  Administered Date(s) Administered  . Hep A / Hep B 06/08/2015, 07/08/2015  . Influenza,inj,Quad PF,36+ Mos 10/13/2014, 10/07/2015  . Tdap 06/08/2015    Health Maintenance  Topic Date Due  . HIV Screening  12/02/1981  . PAP SMEAR  12/03/1987  . INFLUENZA VACCINE  07/25/2016  . TETANUS/TDAP  06/07/2025      Discussed health benefits of physical activity, and encouraged her to engage in regular exercise appropriate for her age and condition.    --------------------------------------------------------------------

## 2015-12-02 ENCOUNTER — Telehealth: Payer: Self-pay

## 2015-12-02 LAB — CBC WITH DIFFERENTIAL/PLATELET
BASOS ABS: 0 10*3/uL (ref 0.0–0.2)
Basos: 0 %
EOS (ABSOLUTE): 0.1 10*3/uL (ref 0.0–0.4)
Eos: 1 %
HEMATOCRIT: 42.6 % (ref 34.0–46.6)
Hemoglobin: 13.9 g/dL (ref 11.1–15.9)
Immature Grans (Abs): 0 10*3/uL (ref 0.0–0.1)
Immature Granulocytes: 0 %
LYMPHS ABS: 1 10*3/uL (ref 0.7–3.1)
Lymphs: 13 %
MCH: 28.8 pg (ref 26.6–33.0)
MCHC: 32.6 g/dL (ref 31.5–35.7)
MCV: 88 fL (ref 79–97)
MONOS ABS: 0.4 10*3/uL (ref 0.1–0.9)
Monocytes: 5 %
NEUTROS PCT: 81 %
Neutrophils Absolute: 6.1 10*3/uL (ref 1.4–7.0)
PLATELETS: 355 10*3/uL (ref 150–379)
RBC: 4.82 x10E6/uL (ref 3.77–5.28)
RDW: 14.2 % (ref 12.3–15.4)
WBC: 7.6 10*3/uL (ref 3.4–10.8)

## 2015-12-02 LAB — LIPID PANEL
CHOL/HDL RATIO: 3.6 ratio (ref 0.0–4.4)
Cholesterol, Total: 176 mg/dL (ref 100–199)
HDL: 49 mg/dL (ref >39)
LDL CALC: 112 mg/dL — AB (ref 0–99)
Triglycerides: 74 mg/dL (ref 0–149)
VLDL Cholesterol Cal: 15 mg/dL (ref 5–40)

## 2015-12-02 LAB — COMPREHENSIVE METABOLIC PANEL
ALK PHOS: 47 IU/L (ref 39–117)
ALT: 12 IU/L (ref 0–32)
AST: 14 IU/L (ref 0–40)
Albumin/Globulin Ratio: 2.2 (ref 1.1–2.5)
Albumin: 4.1 g/dL (ref 3.5–5.5)
BUN/Creatinine Ratio: 21 (ref 9–23)
BUN: 12 mg/dL (ref 6–24)
Bilirubin Total: 0.3 mg/dL (ref 0.0–1.2)
CO2: 22 mmol/L (ref 18–29)
CREATININE: 0.58 mg/dL (ref 0.57–1.00)
Calcium: 9.1 mg/dL (ref 8.7–10.2)
Chloride: 105 mmol/L (ref 97–106)
GFR calc Af Amer: 126 mL/min/{1.73_m2} (ref >59)
GFR calc non Af Amer: 109 mL/min/{1.73_m2} (ref >59)
GLOBULIN, TOTAL: 1.9 g/dL (ref 1.5–4.5)
GLUCOSE: 84 mg/dL (ref 65–99)
Potassium: 4.6 mmol/L (ref 3.5–5.2)
SODIUM: 142 mmol/L (ref 136–144)
Total Protein: 6 g/dL (ref 6.0–8.5)

## 2015-12-02 LAB — TSH: TSH: 2.47 u[IU]/mL (ref 0.450–4.500)

## 2015-12-02 NOTE — Telephone Encounter (Signed)
-----   Message from Mar Daring, [place] sent at 12/02/2015 10:21 AM EST ----- All labs are within normal limits and stable.  Cholesterol has improved! Still awaiting pap results.  Thanks! -JB

## 2015-12-02 NOTE — Telephone Encounter (Signed)
Patient advised as directed below.  Thanks,  -Saralyn Willison 

## 2015-12-04 LAB — PAP IG W/ RFLX HPV ASCU: PAP SMEAR COMMENT: 0

## 2015-12-06 ENCOUNTER — Telehealth: Payer: Self-pay

## 2015-12-06 NOTE — Telephone Encounter (Signed)
Left message to call back  

## 2015-12-06 NOTE — Telephone Encounter (Signed)
-----   Message from Mar Daring, [place] sent at 12/06/2015  8:29 AM EST ----- Pap is normal.  Will repeat in 3 years. Thanks! -JB

## 2015-12-06 NOTE — Telephone Encounter (Signed)
Advised patient of results.  

## 2016-01-11 ENCOUNTER — Ambulatory Visit (INDEPENDENT_AMBULATORY_CARE_PROVIDER_SITE_OTHER): Payer: BLUE CROSS/BLUE SHIELD

## 2016-01-11 DIAGNOSIS — Z23 Encounter for immunization: Secondary | ICD-10-CM

## 2016-01-11 NOTE — Progress Notes (Signed)
Patient here today for Hep AB injection. Given in right deltoid, patient tolerated well.

## 2016-02-21 ENCOUNTER — Encounter: Payer: Self-pay | Admitting: Physician Assistant

## 2016-02-21 DIAGNOSIS — G4700 Insomnia, unspecified: Secondary | ICD-10-CM

## 2016-02-21 MED ORDER — ZOLPIDEM TARTRATE ER 6.25 MG PO TBCR: 6.2500 mg | EXTENDED_RELEASE_TABLET | Freq: Every evening | ORAL | Status: DC | PRN

## 2016-02-21 NOTE — Telephone Encounter (Signed)
Can we please call in Rx for ambien CR 6.25 mg Take one tab PO q h.s. Prn sleep #30 RF 5 to [facility] for patient? Thanks.

## 2016-02-24 NOTE — Telephone Encounter (Signed)
Audrey Blair,  See Mychart message above. If you can't see she is asking for our record of her immunizations be sent to [facility] state registry attn Jeani Hawking [phone].  Can you help with this please?  Thanks!

## 2016-03-06 ENCOUNTER — Encounter: Payer: Self-pay | Admitting: Physician Assistant

## 2016-03-06 DIAGNOSIS — A09 Infectious gastroenteritis and colitis, unspecified: Secondary | ICD-10-CM

## 2016-03-21 MED ORDER — CIPROFLOXACIN HCL 250 MG PO TABS: 250.0000 mg | ORAL_TABLET | Freq: Every day | ORAL | Status: DC

## 2016-03-22 ENCOUNTER — Encounter: Payer: Self-pay | Admitting: Physician Assistant

## 2016-03-22 DIAGNOSIS — T753XXA Motion sickness, initial encounter: Secondary | ICD-10-CM

## 2016-03-22 DIAGNOSIS — B89 Unspecified parasitic disease: Secondary | ICD-10-CM

## 2016-03-22 MED ORDER — ALBENDAZOLE 200 MG PO TABS: 400.0000 mg | ORAL_TABLET | Freq: Once | ORAL | Status: DC

## 2016-03-22 MED ORDER — SCOPOLAMINE 1 MG/3DAYS TD PT72: 1.0000 | MEDICATED_PATCH | TRANSDERMAL | Status: DC

## 2016-04-18 ENCOUNTER — Other Ambulatory Visit: Payer: Self-pay | Admitting: Nurse Practitioner

## 2017-05-14 ENCOUNTER — Encounter: Payer: Self-pay | Admitting: Physician Assistant

## 2017-05-15 ENCOUNTER — Encounter: Payer: Self-pay | Admitting: Family Medicine

## 2017-05-15 ENCOUNTER — Ambulatory Visit (INDEPENDENT_AMBULATORY_CARE_PROVIDER_SITE_OTHER): Payer: BLUE CROSS/BLUE SHIELD | Admitting: Family Medicine

## 2017-05-15 VITALS — BP 100/62 | HR 70 | Temp 98.6°F | Resp 16 | Wt 186.6 lb

## 2017-05-15 DIAGNOSIS — Z7189 Other specified counseling: Secondary | ICD-10-CM

## 2017-05-15 DIAGNOSIS — Z87898 Personal history of other specified conditions: Secondary | ICD-10-CM | POA: Diagnosis not present

## 2017-05-15 DIAGNOSIS — K1120 Sialoadenitis, unspecified: Secondary | ICD-10-CM

## 2017-05-15 DIAGNOSIS — Z7184 Encounter for health counseling related to travel: Secondary | ICD-10-CM

## 2017-05-15 MED ORDER — SCOPOLAMINE 1 MG/3DAYS TD PT72: 1.0000 | MEDICATED_PATCH | TRANSDERMAL | 0 refills | Status: DC

## 2017-05-15 MED ORDER — ALBENDAZOLE 200 MG PO TABS: ORAL_TABLET | ORAL | 0 refills | Status: DC

## 2017-05-15 MED ORDER — CIPROFLOXACIN HCL 500 MG PO TABS: 500.0000 mg | ORAL_TABLET | Freq: Two times a day (BID) | ORAL | 0 refills | Status: DC

## 2017-05-15 NOTE — Progress Notes (Signed)
Subjective:     Patient ID: Audrey Blair, female   DOB: [birth year], 51 y.o.   MRN: [id number]  HPI  Chief Complaint  Patient presents with  . Jaw Pain    Patient comes in office today with concern of jaw pain for the past 24hrs. Patient states that on the left side of her face she had swelling from the jawline up to her ear " it was golfballsize". Patient also states that she has had a "toxic tase" in her mouth and has had tenderness and clicking when she opens her mouth.   States swelling has receded. She is pending dental visit. Also wishes meds for upcoming travel to [place].   Review of Systems     Objective:   Physical Exam  Constitutional: She appears well-developed and well-nourished. No distress.  HENT:  No left parotid swelling noted. No tooth tenderness on percussion.No stone felt on buccal palpation.  Musculoskeletal:  No TMJ tenderness  Lymphadenopathy:    She has no cervical adenopathy.       Assessment:    1. Parotitis  2. H/O motion sickness - scopolamine (TRANSDERM-SCOP, 1.5 MG,) 1 MG/3DAYS; Place 1 patch (1.5 mg total) onto the skin every 3 (three) days.  Dispense: 4 patch; Refill: 0  3. Travel advice encounter - ciprofloxacin (CIPRO) 500 MG tablet; Take 1 tablet (500 mg total) by mouth 2 (two) times daily.  Dispense: 6 tablet; Refill: 0 - albendazole (ALBENZA) 200 MG tablet; Take two tablets once then two tablets in two weeks  Dispense: 4 tablet; Refill: 0    Plan:    Discussed use of salivary stimulants like candy and hydration. Call if recurrent swelling for ENT referral.

## 2017-05-15 NOTE — Patient Instructions (Signed)
Discussed use of candy like sweet tarts or sour balls to stimulate salivary production. Keep hydrated. Do follow up with the dentist. If swelling recurs call for further treatment.

## 2017-05-22 ENCOUNTER — Telehealth: Payer: Self-pay | Admitting: Family Medicine

## 2017-05-22 NOTE — Telephone Encounter (Signed)
Audrey Blair will be resending pre-authorization form by fax. KW

## 2017-05-22 NOTE — Telephone Encounter (Signed)
Audrey Blair with Audrey Blair request that Audrey Blair return her call b/c she has questions about the prior authorization for albendazole (ALBENZA) 200 MG tablet that was sent in on 05/15/17. Audrey Blair stated that she wanted to make sure the PA was being processed. Please advise. Thanks TNP

## 2017-05-24 NOTE — Telephone Encounter (Signed)
Patient has already seen Mikki Santee for this on 05/15/17

## 2017-06-08 ENCOUNTER — Encounter: Payer: Self-pay | Admitting: Physician Assistant

## 2017-06-08 ENCOUNTER — Ambulatory Visit (INDEPENDENT_AMBULATORY_CARE_PROVIDER_SITE_OTHER): Payer: BLUE CROSS/BLUE SHIELD | Admitting: Internal Medicine

## 2017-06-08 DIAGNOSIS — R609 Edema, unspecified: Secondary | ICD-10-CM

## 2017-06-08 DIAGNOSIS — Z7184 Encounter for health counseling related to travel: Secondary | ICD-10-CM

## 2017-06-08 DIAGNOSIS — Z9189 Other specified personal risk factors, not elsewhere classified: Secondary | ICD-10-CM

## 2017-06-08 DIAGNOSIS — Z7189 Other specified counseling: Secondary | ICD-10-CM

## 2017-06-08 DIAGNOSIS — Z207 Contact with and (suspected) exposure to pediculosis, acariasis and other infestations: Secondary | ICD-10-CM

## 2017-06-08 DIAGNOSIS — Z789 Other specified health status: Secondary | ICD-10-CM

## 2017-06-08 MED ORDER — AZITHROMYCIN 500 MG PO TABS: 500.0000 mg | ORAL_TABLET | Freq: Every day | ORAL | 0 refills | Status: DC

## 2017-06-08 MED ORDER — ALBENDAZOLE 200 MG PO TABS: ORAL_TABLET | ORAL | 0 refills | Status: DC

## 2017-06-08 MED ORDER — SPIRONOLACTONE 50 MG PO TABS: 50.0000 mg | ORAL_TABLET | Freq: Every day | ORAL | 5 refills | Status: DC

## 2017-06-08 MED ORDER — LORAZEPAM 0.5 MG PO TABS: 0.5000 mg | ORAL_TABLET | Freq: Three times a day (TID) | ORAL | 0 refills | Status: DC | PRN

## 2017-06-08 MED ORDER — ATOVAQUONE-PROGUANIL HCL 250-100 MG PO TABS: 1.0000 | ORAL_TABLET | Freq: Every day | ORAL | 0 refills | Status: DC

## 2017-06-08 NOTE — Patient Instructions (Signed)
[facility]                [address], [place]                   [place], [place] [address]                      Phone: [phone]                        Fax: [phone]   Planned departure date: June 20   Planned return date: June 30 Countries of travel: [place]  Guidelines for the Prevention & Treatment of Traveler's Diarrhea  Prevention: "Boil it, Peel it, Lacinda Axon it, or Forget it"   the fewer chances -> lower risk: try to stick to food & water precautions as much as possible"   If it's "piping hot"; it is probably okay, if not, it may not be   Treatment   1) You should always take care to drink lots of fluids in order to avoid dehydration   2) You should bring medications with you in case you come down with a case of diarrhea   3) OTC = bring pepto-bismol - can take with initial abdominal symptoms;                    Imodium - can help slow down your intestinal tract, can help relief cramps                    and diarrhea, can take if no bloody diarrhea  Use azithromycin if needed for traveler's diarrhea  Guidelines for the Prevention of Malaria  Avoidance:  -fewer mosquito bites = lower risk. Mosquitos can bite at night as well as daytime  -cover up (long sleeve clothing), mosquito nets, screens  -Insect repellent for your skin ( DEET containing lotion > 20%): for clothes ( permethrin spray)   On June 18th, start malarone for malaria prevention.    Prior to travel:  1) Be sure to pick up appropriate prescriptions, including medicine you take daily. Do not expect to be able to fill your prescriptions abroad.  2) Strongly consider obtaining traveler's insurance, including emergency evacuation insurance. Most plans in the [place] do not cover participants abroad. (see below for resources)  3) Register at the appropriate [place] embassy or consulate with travel dates so they are aware of your presence in-country and for helpful  advice during travel using the [employer] (STEP, GreenNylon.com.cy).  4) Leave contact information with a relative or friend.  5) Keep a Research officer, political party, credit cards in case they become lost or stolen  6) Inform your credit card company that you will be travelling abroad   During travel:  1) If you become ill and need medical advice, the [place] [employer]	[place] of the country you are traveling in general provides a list of [place] speaking doctors.  We are also available on MyChart for remote consultation if you register prior to travel. 2) Avoid motorcycles or scooters when at all possible. Traffic laws in many countries are lax and accidents occur frequently.  3) Do not take any unnecessary risks that you wouldn't do at home.   Resources:  -Country specific information: BlindResource.ca or GreenNylon.com.cy  -Press photographer (DEET, mosquito nets): REI, Data processing manager, Librarian, academic, [employer]  -[employer]  options: gatewayplans.com; http://clayton-rivera.info/; travelguard.com or [employer], gninsurance.com or info@gninsurance .com, [id number].   Post Travel:  If you return from your trip ill, call your primary care doctor or our travel clinic @ [phone].   Enjoy your trip and know that with proper pre-travel preparation, most people have an enjoyable and uninterrupted trip!

## 2017-06-08 NOTE — Progress Notes (Signed)
Subjective:   Audrey Blair is a 51 y.o. female who presents to the Infectious Disease clinic for travel consultation. Planned departure date: June 20 Planned return date: June 30 Countries of travel: [place] Areas in country: urban, [place] Accommodations: guest house.  Purpose of travel: [place] Prior travel out of [place]: yes, [place], [place], [place], [place], [place]     Objective:   Medications:claritin    Assessment:   No contraindications to travel. none    Plan:   Gave pretravel recommendations. No vaccines needed since she is uptodate. 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 Will give low dose ativan 0.5mg  PRN gave #4 no refills

## 2017-08-01 ENCOUNTER — Telehealth: Payer: Self-pay

## 2017-08-01 NOTE — Telephone Encounter (Signed)
LMTCB-wanted to ask more questions about the neck swelling and jaw pain.  Thanks,  -Burney Calzadilla

## 2017-08-02 NOTE — Telephone Encounter (Signed)
[name], I know this patient called back yesterday I wanted to make sure was this addressed? I know computers were down when she called back-aa

## 2017-08-02 NOTE — Telephone Encounter (Signed)
Spoke with patient yesterday afternoon and was advised that Audrey Blair wanted to see her sooner for the off and on chest pain and specially since patient is having other symptoms and just came back from [place]. Patient was advised and requested to be seen on Friday. I was unable to schedule her yesterday, but I put her for tomorrow, Friday at 11 am and LM for patient letting her know I put her down for that time and requested her to call back to confirm. Waiting on phone call. Will give another phone call after lunch today.  Thanks,  -Krystalynn Ridgeway

## 2017-08-03 ENCOUNTER — Ambulatory Visit (INDEPENDENT_AMBULATORY_CARE_PROVIDER_SITE_OTHER): Payer: BLUE CROSS/BLUE SHIELD | Admitting: Physician Assistant

## 2017-08-03 ENCOUNTER — Encounter: Payer: Self-pay | Admitting: Physician Assistant

## 2017-08-03 VITALS — BP 120/74 | HR 71 | Temp 98.3°F | Resp 16 | Ht 66.0 in | Wt 193.0 lb

## 2017-08-03 DIAGNOSIS — K2100 Gastro-esophageal reflux disease with esophagitis, without bleeding: Secondary | ICD-10-CM

## 2017-08-03 DIAGNOSIS — F4329 Adjustment disorder with other symptoms: Secondary | ICD-10-CM | POA: Diagnosis not present

## 2017-08-03 DIAGNOSIS — R079 Chest pain, unspecified: Secondary | ICD-10-CM

## 2017-08-03 DIAGNOSIS — K1120 Sialoadenitis, unspecified: Secondary | ICD-10-CM | POA: Diagnosis not present

## 2017-08-03 MED ORDER — PANTOPRAZOLE SODIUM 40 MG PO TBEC: DELAYED_RELEASE_TABLET | ORAL | 0 refills | Status: DC

## 2017-08-03 NOTE — Patient Instructions (Addendum)
10 Relaxation Techniques That Zap Stress Fast By Jeannette Moninger   Listen  Relax. You deserve it, it's good for you, and it takes less time than you think. You don't need a spa weekend or a retreat. Each of these stress-relieving tips can get you from OMG to om in less than 15 minutes. 1. Meditate  A few minutes of practice per day can help ease anxiety. "Research suggests that daily meditation may alter the brain's neural pathways, making you more resilient to stress," says psychologist Robbie Maller Hartman, PhD, a [place] health and wellness coach. It's simple. Sit up straight with both feet on the floor. Close your eyes. Focus your attention on reciting -- out loud or silently -- a positive mantra such as "I feel at peace" or "I love myself." Place one hand on your belly to sync the mantra with your breaths. Let any distracting thoughts float by like clouds. 2. Breathe Deeply  Take a 5-minute break and focus on your breathing. Sit up straight, eyes closed, with a hand on your belly. Slowly inhale through your nose, feeling the breath start in your abdomen and work its way to the top of your head. Reverse the process as you exhale through your mouth.  "Deep breathing counters the effects of stress by slowing the heart rate and lowering blood pressure," psychologist Judith Tutin, PhD, says. She's a certified life coach in [place], [place] 3. Be Present  Slow down.  "Take 5 minutes and focus on only one behavior with awareness," Tutin says. Notice how the air feels on your face when you're walking and how your feet feel hitting the ground. Enjoy the texture and taste of each bite of food. When you spend time in the moment and focus on your senses, you should feel less tense. 4. Reach Out  Your social network is one of your best tools for handling stress. Talk to others -- preferably face to face, or at least on the phone. Share what's going on. You can get a fresh perspective while keeping your  connection strong. 5. Tune In to Your Body  Mentally scan your body to get a sense of how stress affects it each day. Lie on your back, or sit with your feet on the floor. Start at your toes and work your way up to your scalp, noticing how your body feels.  10 Relaxation Techniques That Zap Stress Fast By Jeannette Moninger   Listen  "Simply be aware of places you feel tight or loose without trying to change anything," Tutin says. For 1 to 2 minutes, imagine each deep breath flowing to that body part. Repeat this process as you move your focus up your body, paying close attention to sensations you feel in each body part. 6. Decompress  Place a warm heat wrap around your neck and shoulders for 10 minutes. Close your eyes and relax your face, neck, upper chest, and back muscles. Remove the wrap, and use a tennis ball or foam roller to massage away tension.  "Place the ball between your back and the wall. Lean into the ball, and hold gentle pressure for up to 15 seconds. Then move the ball to another spot, and apply pressure," says Cathy Benninger, a nurse practitioner and assistant professor at [facility] [employer] [facility] in [place]. 7. Laugh Out Loud  A good belly laugh doesn't just lighten the load mentally. It lowers cortisol, your body's stress hormone, and boosts brain chemicals called endorphins, which help   your mood. Lighten up by tuning in to your favorite sitcom or video, reading the comics, or chatting with someone who makes you smile. 8. Crank Up the [place] shows that listening to soothing music can lower blood pressure, heart rate, and anxiety. "Create a playlist of songs or nature sounds (the ocean, a bubbling brook, birds chirping), and allow your mind to focus on the different melodies, instruments, or singers in the piece," Benninger says. You also can blow off steam by rocking out to more upbeat tunes -- or singing at the top of your lungs! 9. Get Moving    You don't have to run in order to get a runner's high. All forms of exercise, including yoga and walking, can ease depression and anxiety by helping the brain release feel-good chemicals and by giving your body a chance to practice dealing with stress. You can go for a quick walk around the block, take the stairs up and down a few flights, or do some stretching exercises like head rolls and shoulder shrugs. 10. Be Grateful  Keep a gratitude journal or several (one by your bed, one in your purse, and one at work) to help you remember all the things that are good in your life.  "Being grateful for your blessings cancels out negative thoughts and worries," says Joni Emmerling, a wellness coach in [place], [place].  Use these journals to savor good experiences like a child's smile, a sunshine-filled day, and good health. Don't forget to celebrate accomplishments like mastering a new task at work or a new hobby. When you start feeling stressed, spend a few minutes looking through your notes to remind yourself what really matters.    Heartburn Heartburn is a type of pain or discomfort that can happen in the throat or chest. It is often described as a burning pain. It may also cause a bad taste in the mouth. Heartburn may feel worse when you lie down or bend over, and it is often worse at night. Heartburn may be caused by stomach contents that move back up into the esophagus (reflux). Follow these instructions at home: Take these actions to decrease your discomfort and to help avoid complications. Diet  Follow a diet as recommended by your health care provider. This may involve avoiding foods and drinks such as: ? Coffee and tea (with or without caffeine). ? Drinks that contain alcohol. ? Energy drinks and sports drinks. ? Carbonated drinks or sodas. ? Chocolate and cocoa. ? Peppermint and mint flavorings. ? Garlic and onions. ? Horseradish. ? Spicy and acidic foods, including peppers, chili powder,  curry powder, vinegar, hot sauces, and barbecue sauce. ? Citrus fruit juices and citrus fruits, such as oranges, lemons, and limes. ? Tomato-based foods, such as red sauce, chili, salsa, and pizza with red sauce. ? Fried and fatty foods, such as donuts, french fries, potato chips, and high-fat dressings. ? High-fat meats, such as hot dogs and fatty cuts of red and white meats, such as rib eye steak, sausage, ham, and bacon. ? High-fat dairy items, such as whole milk, butter, and cream cheese.  Eat small, frequent meals instead of large meals.  Avoid drinking large amounts of liquid with your meals.  Avoid eating meals during the 2-3 hours before bedtime.  Avoid lying down right after you eat.  Do not exercise right after you eat. General instructions  Pay attention to any changes in your symptoms.  Take over-the-counter and prescription medicines only as told by your  health care provider. Do not take aspirin, ibuprofen, or other NSAIDs unless your health care provider told you to do so.  Do not use any tobacco products, including cigarettes, chewing tobacco, and e-cigarettes. If you need help quitting, ask your health care provider.  Wear loose-fitting clothing. Do not wear anything tight around your waist that causes pressure on your abdomen.  Raise (elevate) the head of your bed about 6 inches (15 cm).  Try to reduce your stress, such as with yoga or meditation. If you need help reducing stress, ask your health care provider.  If you are overweight, reduce your weight to an amount that is healthy for you. Ask your health care provider for guidance about a safe weight loss goal.  Keep all follow-up visits as told by your health care provider. This is important. Contact a health care provider if:  You have new symptoms.  You have unexplained weight loss.  You have difficulty swallowing, or it hurts to swallow.  You have wheezing or a persistent cough.  Your symptoms do not  improve with treatment.  You have frequent heartburn for more than two weeks. Get help right away if:  You have pain in your arms, neck, jaw, teeth, or back.  You feel sweaty, dizzy, or light-headed.  You have chest pain or shortness of breath.  You vomit and your vomit looks like blood or coffee grounds.  Your stool is bloody or black. This information is not intended to replace advice given to you by your health care provider. Make sure you discuss any questions you have with your health care provider. Document Released: 04/29/2009 Document Revised: 05/18/2016 Document Reviewed: 04/07/2015 Elsevier Interactive Patient Education  2017 [employer].

## 2017-08-03 NOTE — Progress Notes (Signed)
Patient: Audrey Blair Female    DOB: [birth year]   51 y.o.   MRN: [id number] Visit Date: 08/03/2017  Today's Provider: Mar Daring, PA-C   Chief Complaint  Patient presents with  . Chest Pain   Subjective:    HPI Chest Pain: Patient complains of chest pain. Onset was 2 weeks ago, with unchanged course since that time. The patient describes the pain as constant, sharp in nature, radiates to the epigastrium, left arm and left shoulder. Patient rates pain as a 7/10 in intensity.  Associated symptoms are chest pressure/discomfort and fatigue. Aggravating factors are none.  Alleviating factors are: none. Patient's cardiac risk factors are none.  Patient's risk factors for DVT/PE: none. Previous cardiac testing: none. She does have h/o gastric ulcer that was found on EGD in 2014.  Patient is requesting referral to ENT due to left jaw pain on and off times several months. Patient reports she has been to dentist and oral surgen and they advised her to go to ENT. She has had known parotitis that comes and goes. She has tried conservative measures without full relief.    No Known Allergies   Current Outpatient Prescriptions:  .  albendazole (ALBENZA) 200 MG tablet, Take two tablets once then two tablets in two weeks, Disp: 4 tablet, Rfl: 0 .  atovaquone-proguanil (MALARONE) 250-100 MG TABS tablet, Take 1 tablet by mouth daily. Start taking on June 18th, take daily on full stomach until complete, Disp: 19 tablet, Rfl: 0 .  azithromycin (ZITHROMAX) 500 MG tablet, Take 1 tablet (500 mg total) by mouth daily. If you have 3+loose stools/24hr. Stop taking if diarrhea resolves, Disp: 5 tablet, Rfl: 0 .  ciprofloxacin (CIPRO) 500 MG tablet, Take 1 tablet (500 mg total) by mouth 2 (two) times daily., Disp: 6 tablet, Rfl: 0 .  loratadine (CLARITIN) 10 MG tablet, Take 10 mg by mouth daily., Disp: , Rfl:  .  LORazepam (ATIVAN) 0.5 MG tablet, Take 1 tablet (0.5 mg total) by mouth every 8 (eight)  hours as needed for anxiety., Disp: 4 tablet, Rfl: 0 .  pantoprazole (PROTONIX) 40 MG tablet, TAKE 1 TABLET (40 MG TOTAL) BY MOUTH DAILY. (Patient not taking: Reported on 05/15/2017), Disp: 30 tablet, Rfl: 1 .  Probiotic Product (NATRUL PROBIOTIC) CAPS, Take 3 capsules by mouth every evening., Disp: , Rfl:  .  scopolamine (TRANSDERM-SCOP, 1.5 MG,) 1 MG/3DAYS, Place 1 patch (1.5 mg total) onto the skin every 3 (three) days., Disp: 4 patch, Rfl: 0 .  spironolactone (ALDACTONE) 50 MG tablet, Take 1 tablet (50 mg total) by mouth daily., Disp: 30 tablet, Rfl: 5  Review of Systems  Constitutional: Positive for fatigue.  HENT: Positive for ear pain and facial swelling (occasionally over left parotid gland when flared). Negative for postnasal drip, rhinorrhea, sinus pain, sinus pressure, sneezing, sore throat and tinnitus.   Eyes: Negative for visual disturbance.  Respiratory: Positive for chest tightness and shortness of breath.   Cardiovascular: Positive for chest pain and leg swelling. Negative for palpitations.  Gastrointestinal: Positive for abdominal pain.  Genitourinary: Negative.   Musculoskeletal: Positive for arthralgias and myalgias.  Neurological: Positive for light-headedness and headaches. Negative for dizziness.    Social History  Substance Use Topics  . Smoking status: Never Smoker  . Smokeless tobacco: Never Used  . Alcohol use No   Objective:   BP 120/74 (BP Location: Left Arm, Patient Position: Sitting, Cuff Size: Large)   Pulse 71   Temp 98.3 F (  36.8 C) (Oral)   Resp 16   Ht 5\' 6"  (1.676 m)   Wt 193 lb (87.5 kg)   SpO2 96%   BMI 31.15 kg/m  Vitals:   08/03/17 1122  BP: 120/74  Pulse: 71  Resp: 16  Temp: 98.3 F (36.8 C)  TempSrc: Oral  SpO2: 96%  Weight: 193 lb (87.5 kg)  Height: 5\' 6"  (1.676 m)     Physical Exam  Constitutional: She appears well-developed and well-nourished. No distress.  HENT:  Head: Normocephalic and atraumatic.    Right Ear:  Hearing, tympanic membrane, external ear and ear canal normal.  Left Ear: Hearing, tympanic membrane, external ear and ear canal normal.  Nose: Nose normal.  Mouth/Throat: Uvula is midline and mucous membranes are normal.  Neck: Normal range of motion. Neck supple. No JVD present. Carotid bruit is not present. No tracheal deviation present. No thyromegaly present.  Cardiovascular: Normal rate, regular rhythm and normal heart sounds.  Exam reveals no gallop and no friction rub.   No murmur heard. Pulmonary/Chest: Effort normal and breath sounds normal. No respiratory distress. She has no wheezes. She has no rales.  Musculoskeletal: She exhibits no edema.  Lymphadenopathy:    She has no cervical adenopathy.  Skin: She is not diaphoretic.  Psychiatric: She has a normal mood and affect. Her speech is normal and behavior is normal.  Vitals reviewed.       Assessment & Plan:     1. GERD with esophagitis I do feel chest pain is most likely secondary to GERD with possible esophagitis. Patient does have high stress and works a lot. She also has chronic neck pain for which she will use IBU occasionally. She does have h/o gastric ulcer. Will restart protonix as below. I will see her back in 2 weeks to see how she is doing.  - pantoprazole (PROTONIX) 40 MG tablet; TAKE 1 TABLET (40 MG TOTAL) BY MOUTH DAILY.  Dispense: 90 tablet; Refill: 0  2. Chest pain, unspecified type EKG showed NSR rate of 68. Low voltage noted in aVL lead only. Low suspicion for cardiac cause. May consider referral to cardiology if fails PPI treatment above.  - EKG 12-Lead  3. Stress reaction Discussed that she has a lot of stress on her and that it can worsen some of her symptoms. Discussed relaxation techniques, adding yoga, and considering PT for her neck pain.   4. Parotitis Will refer to ENT since patient has failed conservative measures and has been evaluated by dentist and oral surgeon. - Ambulatory referral to  ENT       Mar Daring, PA-C  [facility]

## 2017-08-09 ENCOUNTER — Encounter: Payer: Self-pay | Admitting: Physician Assistant

## 2017-08-09 ENCOUNTER — Ambulatory Visit (INDEPENDENT_AMBULATORY_CARE_PROVIDER_SITE_OTHER): Payer: BLUE CROSS/BLUE SHIELD | Admitting: Physician Assistant

## 2017-08-09 VITALS — BP 120/60 | HR 81 | Resp 16 | Ht 66.0 in | Wt 195.4 lb

## 2017-08-09 DIAGNOSIS — Z832 Family history of diseases of the blood and blood-forming organs and certain disorders involving the immune mechanism: Secondary | ICD-10-CM

## 2017-08-09 DIAGNOSIS — Z0000 Encounter for general adult medical examination without abnormal findings: Secondary | ICD-10-CM | POA: Diagnosis not present

## 2017-08-09 DIAGNOSIS — Z13220 Encounter for screening for lipoid disorders: Secondary | ICD-10-CM

## 2017-08-09 DIAGNOSIS — Z136 Encounter for screening for cardiovascular disorders: Secondary | ICD-10-CM

## 2017-08-09 DIAGNOSIS — Z6831 Body mass index (BMI) 31.0-31.9, adult: Secondary | ICD-10-CM | POA: Diagnosis not present

## 2017-08-09 DIAGNOSIS — R768 Other specified abnormal immunological findings in serum: Secondary | ICD-10-CM

## 2017-08-09 DIAGNOSIS — Z131 Encounter for screening for diabetes mellitus: Secondary | ICD-10-CM

## 2017-08-09 DIAGNOSIS — Z1211 Encounter for screening for malignant neoplasm of colon: Secondary | ICD-10-CM

## 2017-08-09 DIAGNOSIS — F320 Major depressive disorder, single episode, mild: Secondary | ICD-10-CM | POA: Diagnosis not present

## 2017-08-09 DIAGNOSIS — Z1231 Encounter for screening mammogram for malignant neoplasm of breast: Secondary | ICD-10-CM | POA: Diagnosis not present

## 2017-08-09 DIAGNOSIS — Z1239 Encounter for other screening for malignant neoplasm of breast: Secondary | ICD-10-CM

## 2017-08-09 MED ORDER — ESCITALOPRAM OXALATE 5 MG PO TABS: 5.0000 mg | ORAL_TABLET | Freq: Every day | ORAL | 1 refills | Status: DC

## 2017-08-09 NOTE — Progress Notes (Signed)
Patient: Audrey Blair, Female    DOB: 11/14/1966, 51 y.o.   MRN: [id number] Visit Date: 08/09/2017  Today's Provider: Mar Daring, PA-C   Chief Complaint  Patient presents with  . Annual Exam   Subjective:    Annual physical exam Audrey Blair is a 51 y.o. female who presents today for health maintenance and complete physical. She feels fairly well. She reports exercising some. She reports she is sleeping fairly well.  Last CPE:12/01/15 Pap: 12/01/15-Negative Mammogram:03/16/14 BI-RADS 4 -----------------------------------------------------------------   Review of Systems  Constitutional: Positive for fatigue and unexpected weight change.  HENT: Positive for facial swelling.   Eyes: Negative.   Respiratory: Negative.   Cardiovascular: Positive for leg swelling.  Gastrointestinal: Negative.   Endocrine: Negative.   Genitourinary: Negative.   Musculoskeletal: Positive for arthralgias and neck stiffness.  Skin: Negative.   Allergic/Immunologic: Positive for environmental allergies.  Neurological: Positive for headaches.  Hematological: Negative.   Psychiatric/Behavioral: Positive for agitation.    Social History      She  reports that she has never smoked. She has never used smokeless tobacco. She reports that she does not drink alcohol or use drugs.       Social History   Social History  . Marital status: Single    Spouse name: N/A  . Number of children: 0  . Years of education: N/A   Occupational History  . [employer]    Social History Main Topics  . Smoking status: Never Smoker  . Smokeless tobacco: Never Used  . Alcohol use No  . Drug use: No  . Sexual activity: Not Asked   Other Topics Concern  . None   Social History Narrative   Daily caffeine     Past Medical History:  Diagnosis Date  . Allergy   . Gastric ulcer   . GERD (gastroesophageal reflux disease)   . Headache    Migraine  . Obesity (BMI 30-39.9)   . PONV  (postoperative nausea and vomiting)   . Shortness of breath dyspnea   . Sleep apnea syndrome Sept 2012   diagnosed with positive sleep study      Patient Active Problem List   Diagnosis Date Noted  . Anxiety 09/09/2015  . Depression 09/09/2015  . Insomnia 09/09/2015  . Palpitations 10/13/2014  . Neck pain of over 3 months duration 10/13/2014  . History of gastric ulcer 03/23/2014  . Positive ANA (antinuclear antibody) 10/20/2012  . Sleep apnea syndrome   . 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 Family history of antiphospholipid syndrome 06/20/2012  . Fatigue 06/20/2012  . Obesity (BMI 30-39.9)     Past Surgical History:  Procedure Laterality Date  . ABDOMINAL HYSTERECTOMY    . ESOPHAGOGASTRODUODENOSCOPY N/A 11/09/2014   Procedure: ESOPHAGOGASTRODUODENOSCOPY (EGD);  Surgeon: Inda Castle, MD;  Location: [facility];  Service: Endoscopy;  Laterality: N/A;  . KNEE ARTHROSCOPY W/ ACL RECONSTRUCTION Right   . MENISCUS REPAIR Right    x 2  . MYOMECTOMY      Family History        Family Status  Relation Status  . Mother Deceased at age 36       died from stroke  . MGM Deceased       died from heart problems  . Father Other  . Neg Hx (Not Specified)        Her family history includes Breast cancer in her mother; Deep vein thrombosis in her mother; Diabetes in her mother;  Heart disease in her maternal grandmother and mother; Other in her mother.     No Known Allergies   Current Outpatient Prescriptions:  .  loratadine (CLARITIN) 10 MG tablet, Take 10 mg by mouth daily., Disp: , Rfl:  .  pantoprazole (PROTONIX) 40 MG tablet, TAKE 1 TABLET (40 MG TOTAL) BY MOUTH DAILY., Disp: 90 tablet, Rfl: 0 .  Probiotic Product (NATRUL PROBIOTIC) CAPS, Take 3 capsules by mouth every evening., Disp: , Rfl:  .  spironolactone (ALDACTONE) 50 MG tablet, Take 1 tablet (50 mg total) by mouth daily., Disp: 30 tablet, Rfl: 5 .  escitalopram (LEXAPRO) 5 MG tablet, Take 1 tablet (5 mg total) by  mouth at bedtime., Disp: 30 tablet, Rfl: 1 .  scopolamine (TRANSDERM-SCOP, 1.5 MG,) 1 MG/3DAYS, Place 1 patch (1.5 mg total) onto the skin every 3 (three) days. (Patient not taking: Reported on 08/09/2017), Disp: 4 patch, Rfl: 0   Patient Care Team: Mar Daring, PA-C as PCP - General (Physician Assistant)      Objective:   Vitals: BP 120/60 (BP Location: Right Arm, Patient Position: Sitting, Cuff Size: Normal)   Pulse 81   Resp 16   Ht 5\' 6"  (1.676 m)   Wt 195 lb 6.4 oz (88.6 kg)   BMI 31.54 kg/m     Physical Exam  Constitutional: She is oriented to person, place, and time. She appears well-developed and well-nourished. No distress.  HENT:  Head: Normocephalic and atraumatic.  Right Ear: Hearing, tympanic membrane, external ear and ear canal normal.  Left Ear: Hearing, tympanic membrane, external ear and ear canal normal.  Nose: Nose normal.  Mouth/Throat: Uvula is midline, oropharynx is clear and moist and mucous membranes are normal. No oropharyngeal exudate.  Eyes: Pupils are equal, round, and reactive to light. Conjunctivae and EOM are normal. Right eye exhibits no discharge. Left eye exhibits no discharge. No scleral icterus.  Neck: Normal range of motion. Neck supple. No JVD present. Carotid bruit is not present. No tracheal deviation present. No thyromegaly present.  Cardiovascular: Normal rate, regular rhythm, normal heart sounds and intact distal pulses.  Exam reveals no gallop and no friction rub.   No murmur heard. Pulmonary/Chest: Effort normal and breath sounds normal. No respiratory distress. She has no wheezes. She has no rales. She exhibits no tenderness. Right breast exhibits no inverted nipple, no mass, no nipple discharge, no skin change and no tenderness. Left breast exhibits no inverted nipple, no mass, no nipple discharge, no skin change and no tenderness. Breasts are symmetrical.  Abdominal: Soft. Bowel sounds are normal. She exhibits no distension and  no mass. There is no tenderness. There is no rebound and no guarding.  Musculoskeletal: Normal range of motion. She exhibits no edema or tenderness.  Lymphadenopathy:    She has no cervical adenopathy.  Neurological: She is alert and oriented to person, place, and time. She has normal reflexes.  Skin: Skin is warm and dry. No rash noted. She is not diaphoretic.  Psychiatric: She has a normal mood and affect. Her behavior is normal. Judgment and thought content normal.  Vitals reviewed.    Depression Screen PHQ 2/9 Scores 08/09/2017  PHQ - 2 Score 2  PHQ- 9 Score 7      Assessment & Plan:     Routine Health Maintenance and Physical Exam  Exercise Activities and Dietary recommendations Goals    None      Immunization History  Administered Date(s) Administered  . Hep A / Hep  B 06/08/2015, 06/08/2015, 07/08/2015, 07/08/2015, 01/11/2016, 01/11/2016  . Influenza,inj,Quad PF,36+ Mos 10/13/2014, 10/07/2015  . Influenza-Unspecified 10/13/2014, 10/07/2015  . Tdap 06/08/2015  . Typhoid Live 06/30/2015  . Yellow Fever 02/24/2016    Health Maintenance  Topic Date Due  . HIV Screening  12/02/1981  . MAMMOGRAM  12/02/2016  . COLONOSCOPY  12/02/2016  . INFLUENZA VACCINE  07/25/2017  . PAP SMEAR  11/30/2018  . TETANUS/TDAP  06/07/2025     Discussed health benefits of physical activity, and encouraged her to engage in regular exercise appropriate for her age and condition.    1. Annual physical exam Normal physical exam today. Will check labs as below and f/u pending lab results. If labs are stable and WNL she will not need to have these rechecked for one year at her next annual physical exam. She is to call the office in the meantime if she has any acute issue, questions or concerns. - CBC with Differential/Platelet - Comprehensive metabolic panel - Hemoglobin A1c - Lipid panel - TSH  2. Breast cancer screening Breast exam today was normal. There is no family history of  breast cancer. She does perform regular self breast exams. Mammogram was ordered as below. Information for [facility] was given to patient so she may schedule her mammogram at her convenience. - MM DIGITAL SCREENING BILATERAL; Future  3. Colon cancer screening Due for colonoscopy. Never had one previously. No family history. - Ambulatory referral to Gastroenterology  4. Encounter for lipid screening for cardiovascular disease Will check labs as below and f/u pending results. - Lipid panel  5. Diabetes mellitus screening Will check labs as below and f/u pending results. - Hemoglobin A1c  6. BMI 31.0-31.9,adult Counseled patient on healthy lifestyle modifications including dieting and exercise.   7. 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 No signs or symptoms. Lab is checked occasionally to monitor number. - ANA w/Reflex if Positive  8. Family history of antiphospholipid syndrome Family history in her mother.  - Antiphospholipid syndrome eval, bld  9. Depression, major, single episode, mild (HCC) Worsening depression, anxiety and stress affecting sleep. Will start lexapro as below. I will see her back in 4-6 weeks to see how she is doing with the medication.  - escitalopram (LEXAPRO) 5 MG tablet; Take 1 tablet (5 mg total) by mouth at bedtime.  Dispense: 30 tablet; Refill: 1  --------------------------------------------------------------------    Mar Daring, PA-C  [facility]

## 2017-08-09 NOTE — Patient Instructions (Signed)
Escitalopram tablets What is this medicine? ESCITALOPRAM (es sye TAL oh pram) is used to treat depression and certain types of anxiety. This medicine may be used for other purposes; ask your health care provider or pharmacist if you have questions. COMMON BRAND NAME(S): Lexapro What should I tell my health care provider before I take this medicine? They need to know if you have any of these conditions: -bipolar disorder or a family history of bipolar disorder -diabetes -glaucoma -heart disease -kidney or liver disease -receiving electroconvulsive therapy -seizures (convulsions) -suicidal thoughts, plans, or attempt by you or a family member -an unusual or allergic reaction to escitalopram, the related drug citalopram, other medicines, foods, dyes, or preservatives -pregnant or trying to become pregnant -breast-feeding How should I use this medicine? Take this medicine by mouth with a glass of water. Follow the directions on the prescription label. You can take it with or without food. If it upsets your stomach, take it with food. Take your medicine at regular intervals. Do not take it more often than directed. Do not stop taking this medicine suddenly except upon the advice of your doctor. Stopping this medicine too quickly may cause serious side effects or your condition may worsen. A special MedGuide will be given to you by the pharmacist with each prescription and refill. Be sure to read this information carefully each time. Talk to your pediatrician regarding the use of this medicine in children. Special care may be needed. Overdosage: If you think you have taken too much of this medicine contact a poison control center or emergency room at once. NOTE: This medicine is only for you. Do not share this medicine with others. What if I miss a dose? If you miss a dose, take it as soon as you can. If it is almost time for your next dose, take only that dose. Do not take double or extra  doses. What may interact with this medicine? Do not take this medicine with any of the following medications: -certain medicines for fungal infections like fluconazole, itraconazole, ketoconazole, posaconazole, voriconazole -cisapride -citalopram -dofetilide -dronedarone -linezolid -MAOIs like Carbex, Eldepryl, Marplan, Nardil, and Parnate -methylene blue (injected into a vein) -pimozide -thioridazine -ziprasidone This medicine may also interact with the following medications: -alcohol -amphetamines -aspirin and aspirin-like medicines -carbamazepine -certain medicines for depression, anxiety, or psychotic disturbances -certain medicines for migraine headache like almotriptan, eletriptan, frovatriptan, naratriptan, rizatriptan, sumatriptan, zolmitriptan -certain medicines for sleep -certain medicines that treat or prevent blood clots like warfarin, enoxaparin, dalteparin -cimetidine -diuretics -fentanyl -furazolidone -isoniazid -lithium -metoprolol -NSAIDs, medicines for pain and inflammation, like ibuprofen or naproxen -other medicines that prolong the QT interval (cause an abnormal heart rhythm) -procarbazine -rasagiline -supplements like St. John's wort, kava kava, valerian -tramadol -tryptophan This list may not describe all possible interactions. Give your health care provider a list of all the medicines, herbs, non-prescription drugs, or dietary supplements you use. Also tell them if you smoke, drink alcohol, or use illegal drugs. Some items may interact with your medicine. What should I watch for while using this medicine? Tell your doctor if your symptoms do not get better or if they get worse. Visit your doctor or health care professional for regular checks on your progress. Because it may take several weeks to see the full effects of this medicine, it is important to continue your treatment as prescribed by your doctor. Patients and their families should watch out for  new or worsening thoughts of suicide or depression. Also watch out for   sudden changes in feelings such as feeling anxious, agitated, panicky, irritable, hostile, aggressive, impulsive, severely restless, overly excited and hyperactive, or not being able to sleep. If this happens, especially at the beginning of treatment or after a change in dose, call your health care professional. Dennis Bast may get drowsy or dizzy. Do not drive, use machinery, or do anything that needs mental alertness until you know how this medicine affects you. Do not stand or sit up quickly, especially if you are an older patient. This reduces the risk of dizzy or fainting spells. Alcohol may interfere with the effect of this medicine. Avoid alcoholic drinks. Your mouth may get dry. Chewing sugarless gum or sucking hard candy, and drinking plenty of water may help. Contact your doctor if the problem does not go away or is severe. What side effects may I notice from receiving this medicine? Side effects that you should report to your doctor or health care professional as soon as possible: -allergic reactions like skin rash, itching or hives, swelling of the face, lips, or tongue -anxious -black, tarry stools -changes in vision -confusion -elevated mood, decreased need for sleep, racing thoughts, impulsive behavior -eye pain -fast, irregular heartbeat -feeling faint or lightheaded, falls -feeling agitated, angry, or irritable -hallucination, loss of contact with reality -loss of balance or coordination -loss of memory -painful or prolonged erections -restlessness, pacing, inability to keep still -seizures -stiff muscles -suicidal thoughts or other mood changes -trouble sleeping -unusual bleeding or bruising -unusually weak or tired -vomiting Side effects that usually do not require medical attention (report to your doctor or health care professional if they continue or are bothersome): -changes in appetite -change in sex  drive or performance -headache -increased sweating -indigestion, nausea -tremors This list may not describe all possible side effects. Call your doctor for medical advice about side effects. You may report side effects to FDA at 1-800-FDA-1088. Where should I keep my medicine? Keep out of reach of children. Store at room temperature between 15 and 30 degrees C (59 and 86 degrees F). Throw away any unused medicine after the expiration date. NOTE: This sheet is a summary. It may not cover all possible information. If you have questions about this medicine, talk to your doctor, pharmacist, or health care provider.  2018 Elsevier/Gold Standard (2016-05-15 13:20:23) Health Maintenance, Female Adopting a healthy lifestyle and getting preventive care can go a long way to promote health and wellness. Talk with your health care provider about what schedule of regular examinations is right for you. This is a good chance for you to check in with your provider about disease prevention and staying healthy. In between checkups, there are plenty of things you can do on your own. Experts have done a lot of research about which lifestyle changes and preventive measures are most likely to keep you healthy. Ask your health care provider for more information. Weight and diet Eat a healthy diet  Be sure to include plenty of vegetables, fruits, low-fat dairy products, and lean protein.  Do not eat a lot of foods high in solid fats, added sugars, or salt.  Get regular exercise. This is one of the most important things you can do for your health. ? Most adults should exercise for at least 150 minutes each week. The exercise should increase your heart rate and make you sweat (moderate-intensity exercise). ? Most adults should also do strengthening exercises at least twice a week. This is in addition to the moderate-intensity exercise.  Maintain a healthy  weight  Body mass index (BMI) is a measurement that can be  used to identify possible weight problems. It estimates body fat based on height and weight. Your health care provider can help determine your BMI and help you achieve or maintain a healthy weight.  For females 61 years of age and older: ? A BMI below 18.5 is considered underweight. ? A BMI of 18.5 to 24.9 is normal. ? A BMI of 25 to 29.9 is considered overweight. ? A BMI of 30 and above is considered obese.  Watch levels of cholesterol and blood lipids  You should start having your blood tested for lipids and cholesterol at 51 years of age, then have this test every 5 years.  You may need to have your cholesterol levels checked more often if: ? Your lipid or cholesterol levels are high. ? You are older than 51 years of age. ? You are at high risk for heart disease.  Cancer screening Lung Cancer  Lung cancer screening is recommended for adults 30-2 years old who are at high risk for lung cancer because of a history of smoking.  A yearly low-dose CT scan of the lungs is recommended for people who: ? Currently smoke. ? Have quit within the past 15 years. ? Have at least a 30-pack-year history of smoking. A pack year is smoking an average of one pack of cigarettes a day for 1 year.  Yearly screening should continue until it has been 15 years since you quit.  Yearly screening should stop if you develop a health problem that would prevent you from having lung cancer treatment.  Breast Cancer  Practice breast self-awareness. This means understanding how your breasts normally appear and feel.  It also means doing regular breast self-exams. Let your health care provider know about any changes, no matter how small.  If you are in your 20s or 30s, you should have a clinical breast exam (CBE) by a health care provider every 1-3 years as part of a regular health exam.  If you are 3 or older, have a CBE every year. Also consider having a breast X-ray (mammogram) every year.  If you have  a family history of breast cancer, talk to your health care provider about genetic screening.  If you are at high risk for breast cancer, talk to your health care provider about having an MRI and a mammogram every year.  Breast cancer gene (BRCA) assessment is recommended for women who have family members with BRCA-related cancers. BRCA-related cancers include: ? Breast. ? Ovarian. ? Tubal. ? Peritoneal cancers.  Results of the assessment will determine the need for genetic counseling and BRCA1 and BRCA2 testing.  Cervical Cancer Your health care provider may recommend that you be screened regularly for cancer of the pelvic organs (ovaries, uterus, and vagina). This screening involves a pelvic examination, including checking for microscopic changes to the surface of your cervix (Pap test). You may be encouraged to have this screening done every 3 years, beginning at age 67.  For women ages 11-65, health care providers may recommend pelvic exams and Pap testing every 3 years, or they may recommend the Pap and pelvic exam, combined with testing for human papilloma virus (HPV), every 5 years. Some types of HPV increase your risk of cervical cancer. Testing for HPV may also be done on women of any age with unclear Pap test results.  Other health care providers may not recommend any screening for nonpregnant women who are considered  low risk for pelvic cancer and who do not have symptoms. Ask your health care provider if a screening pelvic exam is right for you.  If you have had past treatment for cervical cancer or a condition that could lead to cancer, you need Pap tests and screening for cancer for at least 20 years after your treatment. If Pap tests have been discontinued, your risk factors (such as having a new sexual partner) need to be reassessed to determine if screening should resume. Some women have medical problems that increase the chance of getting cervical cancer. In these cases, your  health care provider may recommend more frequent screening and Pap tests.  Colorectal Cancer  This type of cancer can be detected and often prevented.  Routine colorectal cancer screening usually begins at 51 years of age and continues through 51 years of age.  Your health care provider may recommend screening at an earlier age if you have risk factors for colon cancer.  Your health care provider may also recommend using home test kits to check for hidden blood in the stool.  A small camera at the end of a tube can be used to examine your colon directly (sigmoidoscopy or colonoscopy). This is done to check for the earliest forms of colorectal cancer.  Routine screening usually begins at age 50.  Direct examination of the colon should be repeated every 5-10 years through 51 years of age. However, you may need to be screened more often if early forms of precancerous polyps or small growths are found.  Skin Cancer  Check your skin from head to toe regularly.  Tell your health care provider about any new moles or changes in moles, especially if there is a change in a mole's shape or color.  Also tell your health care provider if you have a mole that is larger than the size of a pencil eraser.  Always use sunscreen. Apply sunscreen liberally and repeatedly throughout the day.  Protect yourself by wearing long sleeves, pants, a wide-brimmed hat, and sunglasses whenever you are outside.  Heart disease, diabetes, and high blood pressure  High blood pressure causes heart disease and increases the risk of stroke. High blood pressure is more likely to develop in: ? People who have blood pressure in the high end of the normal range (130-139/85-89 mm Hg). ? People who are overweight or obese. ? People who are African American.  If you are 51-35 years of age, have your blood pressure checked every 3-5 years. If you are 62 years of age or older, have your blood pressure checked every year. You  should have your blood pressure measured twice-once when you are at a hospital or clinic, and once when you are not at a hospital or clinic. Record the average of the two measurements. To check your blood pressure when you are not at a hospital or clinic, you can use: ? An automated blood pressure machine at a pharmacy. ? A home blood pressure monitor.  If you are between 74 years and 29 years old, ask your health care provider if you should take aspirin to prevent strokes.  Have regular diabetes screenings. This involves taking a blood sample to check your fasting blood sugar level. ? If you are at a normal weight and have a low risk for diabetes, have this test once every three years after 51 years of age. ? If you are overweight and have a high risk for diabetes, consider being tested at a younger  age or more often. Preventing infection Hepatitis B  If you have a higher risk for hepatitis B, you should be screened for this virus. You are considered at high risk for hepatitis B if: ? You were born in a country where hepatitis B is common. Ask your health care provider which countries are considered high risk. ? Your parents were born in a high-risk country, and you have not been immunized against hepatitis B (hepatitis B vaccine). ? You have HIV or AIDS. ? You use needles to inject street drugs. ? You live with someone who has hepatitis B. ? You have had sex with someone who has hepatitis B. ? You get hemodialysis treatment. ? You take certain medicines for conditions, including cancer, organ transplantation, and autoimmune conditions.  Hepatitis C  Blood testing is recommended for: ? Everyone born from [birth year] through 1965. ? Anyone with known risk factors for hepatitis C.  Sexually transmitted infections (STIs)  You should be screened for sexually transmitted infections (STIs) including gonorrhea and chlamydia if: ? You are sexually active and are younger than 51 years of age. ? You  are older than 51 years of age and your health care provider tells you that you are at risk for this type of infection. ? Your sexual activity has changed since you were last screened and you are at an increased risk for chlamydia or gonorrhea. Ask your health care provider if you are at risk.  If you do not have HIV, but are at risk, it may be recommended that you take a prescription medicine daily to prevent HIV infection. This is called pre-exposure prophylaxis (PrEP). You are considered at risk if: ? You are sexually active and do not regularly use condoms or know the HIV status of your partner(s). ? You take drugs by injection. ? You are sexually active with a partner who has HIV.  Talk with your health care provider about whether you are at high risk of being infected with HIV. If you choose to begin PrEP, you should first be tested for HIV. You should then be tested every 3 months for as long as you are taking PrEP. Pregnancy  If you are premenopausal and you may become pregnant, ask your health care provider about preconception counseling.  If you may become pregnant, take 400 to 800 micrograms (mcg) of folic acid every day.  If you want to prevent pregnancy, talk to your health care provider about birth control (contraception). Osteoporosis and menopause  Osteoporosis is a disease in which the bones lose minerals and strength with aging. This can result in serious bone fractures. Your risk for osteoporosis can be identified using a bone density scan.  If you are 92 years of age or older, or if you are at risk for osteoporosis and fractures, ask your health care provider if you should be screened.  Ask your health care provider whether you should take a calcium or vitamin D supplement to lower your risk for osteoporosis.  Menopause may have certain physical symptoms and risks.  Hormone replacement therapy may reduce some of these symptoms and risks. Talk to your health care  provider about whether hormone replacement therapy is right for you. Follow these instructions at home:  Schedule regular health, dental, and eye exams.  Stay current with your immunizations.  Do not use any tobacco products including cigarettes, chewing tobacco, or electronic cigarettes.  If you are pregnant, do not drink alcohol.  If you are breastfeeding, limit how much  and how often you drink alcohol.  Limit alcohol intake to no more than 1 drink per day for nonpregnant women. One drink equals 12 ounces of beer, 5 ounces of wine, or 1 ounces of hard liquor.  Do not use street drugs.  Do not share needles.  Ask your health care provider for help if you need support or information about quitting drugs.  Tell your health care provider if you often feel depressed.  Tell your health care provider if you have ever been abused or do not feel safe at home. This information is not intended to replace advice given to you by your health care provider. Make sure you discuss any questions you have with your health care provider. Document Released: 06/26/2011 Document Revised: 05/18/2016 Document Reviewed: 09/14/2015 Elsevier Interactive Patient Education  [employer].

## 2017-08-13 LAB — ANTIPHOSPHOLIPID SYNDROME EVAL, BLD
APTT PPP: 26.4 s (ref 22.9–30.2)
Anticardiolipin IgG: <9 GPL U/mL (ref 0–14)
Beta-2 Glyco I IgG: <9 GPI IgG units (ref 0–20)
DRVVT: 39 s (ref 0.0–47.0)
Hexagonal Phase Phospholipid: 8 s (ref 0–11)
INR: 1 (ref 0.9–1.1)
PT: 10.4 s (ref 9.6–11.5)
THROMBIN TIME: 17.2 s (ref 0.0–23.0)

## 2017-08-13 LAB — ANA W/REFLEX IF POSITIVE
Anti JO-1: <0.2 AI (ref 0.0–0.9)
Anti Nuclear Antibody(ANA): POSITIVE — AB
CENTROMERE AB SCREEN: 3.8 AI — AB (ref 0.0–0.9)
ENA SSA (RO) Ab: <0.2 AI (ref 0.0–0.9)
dsDNA Ab: <1 IU/mL (ref 0–9)

## 2017-08-13 LAB — CBC WITH DIFFERENTIAL/PLATELET
Basophils Absolute: 0 10*3/uL (ref 0.0–0.2)
Basos: 0 %
EOS (ABSOLUTE): 0.2 10*3/uL (ref 0.0–0.4)
EOS: 3 %
HEMATOCRIT: 40.7 % (ref 34.0–46.6)
HEMOGLOBIN: 13.7 g/dL (ref 11.1–15.9)
Immature Grans (Abs): 0 10*3/uL (ref 0.0–0.1)
Immature Granulocytes: 0 %
LYMPHS ABS: 0.9 10*3/uL (ref 0.7–3.1)
Lymphs: 21 %
MCH: 29.2 pg (ref 26.6–33.0)
MCHC: 33.7 g/dL (ref 31.5–35.7)
MCV: 87 fL (ref 79–97)
MONOCYTES: 6 %
Monocytes Absolute: 0.3 10*3/uL (ref 0.1–0.9)
NEUTROS ABS: 3.1 10*3/uL (ref 1.4–7.0)
Neutrophils: 70 %
Platelets: 303 10*3/uL (ref 150–379)
RBC: 4.69 x10E6/uL (ref 3.77–5.28)
RDW: 13.9 % (ref 12.3–15.4)
WBC: 4.4 10*3/uL (ref 3.4–10.8)

## 2017-08-13 LAB — COMPREHENSIVE METABOLIC PANEL
ALBUMIN: 4.1 g/dL (ref 3.5–5.5)
ALK PHOS: 42 IU/L (ref 39–117)
ALT: 16 IU/L (ref 0–32)
AST: 18 IU/L (ref 0–40)
Albumin/Globulin Ratio: 2 (ref 1.2–2.2)
BILIRUBIN TOTAL: 0.2 mg/dL (ref 0.0–1.2)
BUN / CREAT RATIO: 21 (ref 9–23)
BUN: 15 mg/dL (ref 6–24)
CO2: 19 mmol/L — AB (ref 20–29)
CREATININE: 0.71 mg/dL (ref 0.57–1.00)
Calcium: 8.8 mg/dL (ref 8.7–10.2)
Chloride: 108 mmol/L — ABNORMAL HIGH (ref 96–106)
GFR calc Af Amer: 115 mL/min/{1.73_m2} (ref >59)
GFR calc non Af Amer: 100 mL/min/{1.73_m2} (ref >59)
GLOBULIN, TOTAL: 2.1 g/dL (ref 1.5–4.5)
Glucose: 86 mg/dL (ref 65–99)
Potassium: 4.6 mmol/L (ref 3.5–5.2)
SODIUM: 142 mmol/L (ref 134–144)
Total Protein: 6.2 g/dL (ref 6.0–8.5)

## 2017-08-13 LAB — TSH: TSH: 1.9 u[IU]/mL (ref 0.450–4.500)

## 2017-08-13 LAB — LIPID PANEL
Chol/HDL Ratio: 3.5 ratio (ref 0.0–4.4)
Cholesterol, Total: 186 mg/dL (ref 100–199)
HDL: 53 mg/dL (ref >39)
LDL Calculated: 121 mg/dL — ABNORMAL HIGH (ref 0–99)
Triglycerides: 62 mg/dL (ref 0–149)
VLDL Cholesterol Cal: 12 mg/dL (ref 5–40)

## 2017-08-13 LAB — HEMOGLOBIN A1C
Est. average glucose Bld gHb Est-mCnc: 103 mg/dL
HEMOGLOBIN A1C: 5.2 % (ref 4.8–5.6)

## 2017-08-13 LAB — COAG STUDIES INTERP REPORT

## 2017-08-22 ENCOUNTER — Encounter: Payer: Self-pay | Admitting: Physician Assistant

## 2017-08-24 ENCOUNTER — Other Ambulatory Visit: Payer: Self-pay | Admitting: Otolaryngology

## 2017-08-24 ENCOUNTER — Other Ambulatory Visit: Payer: Self-pay

## 2017-08-24 ENCOUNTER — Telehealth: Payer: Self-pay

## 2017-08-24 ENCOUNTER — Ambulatory Visit
Admission: RE | Admit: 2017-08-24 | Discharge: 2017-08-24 | Disposition: A | Discharge status: Home / Self Care | Payer: BLUE CROSS/BLUE SHIELD | Source: Ambulatory Visit | Attending: Physician Assistant | Admitting: Physician Assistant

## 2017-08-24 DIAGNOSIS — Z1231 Encounter for screening mammogram for malignant neoplasm of breast: Secondary | ICD-10-CM | POA: Diagnosis not present

## 2017-08-24 DIAGNOSIS — Z1211 Encounter for screening for malignant neoplasm of colon: Secondary | ICD-10-CM

## 2017-08-24 DIAGNOSIS — R928 Other abnormal and inconclusive findings on diagnostic imaging of breast: Secondary | ICD-10-CM | POA: Insufficient documentation

## 2017-08-24 DIAGNOSIS — Z1239 Encounter for other screening for malignant neoplasm of breast: Secondary | ICD-10-CM

## 2017-08-24 DIAGNOSIS — R221 Localized swelling, mass and lump, neck: Secondary | ICD-10-CM

## 2017-08-24 NOTE — Telephone Encounter (Signed)
Gastroenterology Pre-Procedure Review  Request Date: 9/27 Requesting Physician: Dr. Marius Ditch  *No major illnesses at this time.  PATIENT REVIEW QUESTIONS: The patient responded to the following health history questions as indicated:    1. Are you having any GI issues? no 2. Do you have a personal history of Polyps? no 3. Do you have a family history of Colon Cancer or Polyps? no 4. Diabetes Mellitus? no 5. Joint replacements in the past 12 months?no 6. Major health problems in the past 3 months?no 7. Any artificial heart valves, MVP, or defibrillator?no    MEDICATIONS & ALLERGIES:    Patient reports the following regarding taking any anticoagulation/antiplatelet therapy:   Plavix, Coumadin, Eliquis, Xarelto, Lovenox, Pradaxa, Brilinta, or Effient? no Aspirin? no  Patient confirms/reports the following medications:  Current Outpatient Prescriptions  Medication Sig Dispense Refill  . escitalopram (LEXAPRO) 5 MG tablet Take 1 tablet (5 mg total) by mouth at bedtime. 30 tablet 1  . loratadine (CLARITIN) 10 MG tablet Take 10 mg by mouth daily.    . pantoprazole (PROTONIX) 40 MG tablet TAKE 1 TABLET (40 MG TOTAL) BY MOUTH DAILY. 90 tablet 0  . Probiotic Product (NATRUL PROBIOTIC) CAPS Take 3 capsules by mouth every evening.    Marland Kitchen scopolamine (TRANSDERM-SCOP, 1.5 MG,) 1 MG/3DAYS Place 1 patch (1.5 mg total) onto the skin every 3 (three) days. (Patient not taking: Reported on 08/09/2017) 4 patch 0  . spironolactone (ALDACTONE) 50 MG tablet Take 1 tablet (50 mg total) by mouth daily. 30 tablet 5   No current facility-administered medications for this visit.     Patient confirms/reports the following allergies:  No Known Allergies  No orders of the defined types were placed in this encounter.   AUTHORIZATION INFORMATION Primary Insurance: 1D#: Group #:  Secondary Insurance: 1D#: Group #:  SCHEDULE INFORMATION: Date: 9/27 Time: Location: [facility]

## 2017-09-05 ENCOUNTER — Other Ambulatory Visit: Payer: Self-pay | Admitting: *Deleted

## 2017-09-05 ENCOUNTER — Inpatient Hospital Stay
Admission: RE | Admit: 2017-09-05 | Discharge: 2017-09-05 | Disposition: A | Discharge status: Home / Self Care | Payer: Self-pay | Source: Ambulatory Visit | Attending: *Deleted | Admitting: *Deleted

## 2017-09-05 DIAGNOSIS — Z9289 Personal history of other medical treatment: Secondary | ICD-10-CM

## 2017-09-06 ENCOUNTER — Telehealth: Payer: Self-pay

## 2017-09-06 ENCOUNTER — Other Ambulatory Visit: Payer: Self-pay | Admitting: Physician Assistant

## 2017-09-06 DIAGNOSIS — N6310 Unspecified lump in the right breast, unspecified quadrant: Secondary | ICD-10-CM

## 2017-09-06 DIAGNOSIS — R928 Other abnormal and inconclusive findings on diagnostic imaging of breast: Secondary | ICD-10-CM

## 2017-09-06 NOTE — Telephone Encounter (Signed)
-----   Message from Mar Daring, [place] sent at 09/06/2017  2:30 PM EDT ----- Mammogram of the right breast revealed a possible lesion that warrants further evaluation. [facility] will be contacting you to schedule a diagnostic mammogram and US of the right breast for further evaluation of this lesion.

## 2017-09-06 NOTE — Telephone Encounter (Signed)
Patient advised as directed below.  Thanks,  -[name] 

## 2017-09-12 ENCOUNTER — Ambulatory Visit
Admission: RE | Admit: 2017-09-12 | Discharge: 2017-09-12 | Disposition: A | Discharge status: Home / Self Care | Payer: BLUE CROSS/BLUE SHIELD | Source: Ambulatory Visit | Attending: Otolaryngology | Admitting: Otolaryngology

## 2017-09-12 DIAGNOSIS — D37030 Neoplasm of uncertain behavior of the parotid salivary glands: Secondary | ICD-10-CM | POA: Insufficient documentation

## 2017-09-12 DIAGNOSIS — R221 Localized swelling, mass and lump, neck: Secondary | ICD-10-CM

## 2017-09-12 MED ORDER — IOPAMIDOL (ISOVUE-300) INJECTION 61%
75.0000 mL | Freq: Once | INTRAVENOUS | Status: AC | PRN
  Administered 2017-09-12: 75 mL via INTRAVENOUS

## 2017-09-13 ENCOUNTER — Ambulatory Visit
Admission: RE | Admit: 2017-09-13 | Discharge: 2017-09-13 | Disposition: A | Discharge status: Home / Self Care | Payer: BLUE CROSS/BLUE SHIELD | Source: Ambulatory Visit | Attending: Physician Assistant | Admitting: Physician Assistant

## 2017-09-13 DIAGNOSIS — N6310 Unspecified lump in the right breast, unspecified quadrant: Secondary | ICD-10-CM | POA: Insufficient documentation

## 2017-09-13 DIAGNOSIS — N6001 Solitary cyst of right breast: Secondary | ICD-10-CM | POA: Diagnosis not present

## 2017-09-13 DIAGNOSIS — R928 Other abnormal and inconclusive findings on diagnostic imaging of breast: Secondary | ICD-10-CM | POA: Diagnosis present

## 2017-09-19 ENCOUNTER — Telehealth: Payer: Self-pay | Admitting: Gastroenterology

## 2017-09-19 ENCOUNTER — Encounter: Payer: Self-pay | Admitting: *Deleted

## 2017-09-19 NOTE — Telephone Encounter (Signed)
Please call pt to reschedule her colonoscopy.

## 2017-09-19 NOTE — Telephone Encounter (Signed)
Error

## 2017-09-20 ENCOUNTER — Ambulatory Visit: Admission: RE | Admit: 2017-09-20 | Payer: BLUE CROSS/BLUE SHIELD | Source: Ambulatory Visit

## 2017-09-20 ENCOUNTER — Encounter: Admission: RE | Payer: Self-pay | Source: Ambulatory Visit

## 2017-09-20 SURGERY — COLONOSCOPY WITH PROPOFOL
Anesthesia: General

## 2017-09-24 ENCOUNTER — Telehealth: Payer: Self-pay | Admitting: Gastroenterology

## 2017-09-24 ENCOUNTER — Telehealth: Payer: Self-pay

## 2017-09-24 ENCOUNTER — Other Ambulatory Visit: Payer: Self-pay

## 2017-09-24 DIAGNOSIS — Z1211 Encounter for screening for malignant neoplasm of colon: Secondary | ICD-10-CM

## 2017-09-24 DIAGNOSIS — Z1212 Encounter for screening for malignant neoplasm of rectum: Principal | ICD-10-CM

## 2017-09-24 NOTE — Telephone Encounter (Signed)
[id number] 

## 2017-09-24 NOTE — Telephone Encounter (Signed)
[email or web address] 

## 2017-09-24 NOTE — Telephone Encounter (Signed)
-----   Message from Mar Daring, [place] sent at 09/14/2017  8:56 AM EDT ----- Mass on right breast that had been seen on mammography was determined to be a simple cyst on US of the right breast. Recommended to return to routine screenings yearly.

## 2017-09-24 NOTE — Telephone Encounter (Signed)
Patient LVM to schedule colonscopy.

## 2017-10-01 ENCOUNTER — Telehealth: Payer: Self-pay | Admitting: Physician Assistant

## 2017-10-01 NOTE — Telephone Encounter (Signed)
Pt's insurance company is calling requesting a prior auth for Supret for her colonoscopy that she is to have 10/04/17.  Please contact the pharmacy [facility] [phone].  Per pharmacy they faxed over form on 09/19/17.

## 2017-10-02 ENCOUNTER — Encounter: Payer: Self-pay | Admitting: Physician Assistant

## 2017-10-03 NOTE — Telephone Encounter (Signed)
Erin at [facility] advised.

## 2017-10-03 NOTE — Telephone Encounter (Signed)
Have not seen. Most likely sent to GI as they would have prescribed this.

## 2017-10-03 NOTE — Telephone Encounter (Signed)
LM this morning letting the patient know we have not seen this prescription. That most likely it was sent to GI as they would have prescribed Supret for the Colonoscopy. Tawanna Sat also responded to the patient through [place].  Thanks,  -[name]

## 2017-10-04 ENCOUNTER — Encounter
Admission: RE | Disposition: A | Discharge status: Home / Self Care | Payer: Self-pay | Source: Ambulatory Visit | Attending: Gastroenterology

## 2017-10-04 ENCOUNTER — Ambulatory Visit
Admission: RE | Admit: 2017-10-04 | Discharge: 2017-10-04 | Disposition: A | Discharge status: Home / Self Care | Payer: BLUE CROSS/BLUE SHIELD | Source: Ambulatory Visit | Attending: Gastroenterology | Admitting: Gastroenterology

## 2017-10-04 ENCOUNTER — Ambulatory Visit: Payer: BLUE CROSS/BLUE SHIELD | Admitting: Certified Registered Nurse Anesthetist

## 2017-10-04 ENCOUNTER — Encounter: Payer: Self-pay | Admitting: *Deleted

## 2017-10-04 DIAGNOSIS — Z1212 Encounter for screening for malignant neoplasm of rectum: Secondary | ICD-10-CM

## 2017-10-04 DIAGNOSIS — E669 Obesity, unspecified: Secondary | ICD-10-CM | POA: Insufficient documentation

## 2017-10-04 DIAGNOSIS — G4730 Sleep apnea, unspecified: Secondary | ICD-10-CM | POA: Insufficient documentation

## 2017-10-04 DIAGNOSIS — Z1211 Encounter for screening for malignant neoplasm of colon: Secondary | ICD-10-CM | POA: Diagnosis not present

## 2017-10-04 DIAGNOSIS — K219 Gastro-esophageal reflux disease without esophagitis: Secondary | ICD-10-CM | POA: Insufficient documentation

## 2017-10-04 DIAGNOSIS — Z90710 Acquired absence of both cervix and uterus: Secondary | ICD-10-CM | POA: Diagnosis not present

## 2017-10-04 DIAGNOSIS — Z8249 Family history of ischemic heart disease and other diseases of the circulatory system: Secondary | ICD-10-CM | POA: Diagnosis not present

## 2017-10-04 DIAGNOSIS — R0602 Shortness of breath: Secondary | ICD-10-CM | POA: Insufficient documentation

## 2017-10-04 DIAGNOSIS — K529 Noninfective gastroenteritis and colitis, unspecified: Secondary | ICD-10-CM | POA: Insufficient documentation

## 2017-10-04 DIAGNOSIS — Z833 Family history of diabetes mellitus: Secondary | ICD-10-CM | POA: Diagnosis not present

## 2017-10-04 DIAGNOSIS — Z803 Family history of malignant neoplasm of breast: Secondary | ICD-10-CM | POA: Insufficient documentation

## 2017-10-04 DIAGNOSIS — G43909 Migraine, unspecified, not intractable, without status migrainosus: Secondary | ICD-10-CM | POA: Insufficient documentation

## 2017-10-04 DIAGNOSIS — D122 Benign neoplasm of ascending colon: Secondary | ICD-10-CM | POA: Insufficient documentation

## 2017-10-04 DIAGNOSIS — Z79899 Other long term (current) drug therapy: Secondary | ICD-10-CM | POA: Insufficient documentation

## 2017-10-04 DIAGNOSIS — Z8711 Personal history of peptic ulcer disease: Secondary | ICD-10-CM | POA: Diagnosis not present

## 2017-10-04 DIAGNOSIS — Z6830 Body mass index (BMI) 30.0-30.9, adult: Secondary | ICD-10-CM | POA: Diagnosis not present

## 2017-10-04 DIAGNOSIS — D120 Benign neoplasm of cecum: Secondary | ICD-10-CM | POA: Diagnosis not present

## 2017-10-04 HISTORY — PX: COLONOSCOPY WITH PROPOFOL: SHX5780

## 2017-10-04 SURGERY — COLONOSCOPY WITH PROPOFOL
Anesthesia: General

## 2017-10-04 MED ORDER — LIDOCAINE HCL (CARDIAC) 20 MG/ML IV SOLN
INTRAVENOUS | Status: DC | PRN
  Administered 2017-10-04: 100 mg via INTRAVENOUS

## 2017-10-04 MED ORDER — PROPOFOL 10 MG/ML IV BOLUS
INTRAVENOUS | Status: AC
  Filled 2017-10-04: qty 20

## 2017-10-04 MED ORDER — PROPOFOL 500 MG/50ML IV EMUL
INTRAVENOUS | Status: AC
  Filled 2017-10-04: qty 50

## 2017-10-04 MED ORDER — PROPOFOL 10 MG/ML IV BOLUS
INTRAVENOUS | Status: DC | PRN
Start: 2017-10-04 — End: 2017-10-04
  Administered 2017-10-04: 50 mg via INTRAVENOUS
  Administered 2017-10-04: 30 mg via INTRAVENOUS

## 2017-10-04 MED ORDER — PROPOFOL 500 MG/50ML IV EMUL
INTRAVENOUS | Status: DC | PRN
  Administered 2017-10-04: 150 ug/kg/min via INTRAVENOUS

## 2017-10-04 MED ORDER — SODIUM CHLORIDE 0.9 % IV SOLN
INTRAVENOUS | Status: DC
  Administered 2017-10-04: 1000 mL via INTRAVENOUS

## 2017-10-04 MED ORDER — LIDOCAINE HCL (PF) 2 % IJ SOLN
INTRAMUSCULAR | Status: AC
  Filled 2017-10-04: qty 10

## 2017-10-04 NOTE — H&P (Signed)
Cephas Darby, MD [address]  [place], [place] [address]  Main: [phone]  Fax: [phone] Pager: [phone]  Primary Care Physician:  Mar Daring, PA-C Primary Gastroenterologist:  Dr. Cephas Darby  Pre-Procedure History & Physical: HPI:  Audrey Blair is a 51 y.o. female is here for an colonoscopy.   Past Medical History:  Diagnosis Date  . Allergy   . Gastric ulcer   . GERD (gastroesophageal reflux disease)   . Headache    Migraine  . Obesity (BMI 30-39.9)   . PONV (postoperative nausea and vomiting)   . Shortness of breath dyspnea   . Sleep apnea syndrome Sept 2012   diagnosed with positive sleep study     Past Surgical History:  Procedure Laterality Date  . ABDOMINAL HYSTERECTOMY    . BREAST BIOPSY Left 07/01/2010   [place] bx with clip. fibroadenoma  . BREAST CYST ASPIRATION Right 2015   6-7:00 cyst  . ESOPHAGOGASTRODUODENOSCOPY N/A 11/09/2014   Procedure: ESOPHAGOGASTRODUODENOSCOPY (EGD);  Surgeon: Inda Castle, MD;  Location: [facility];  Service: Endoscopy;  Laterality: N/A;  . KNEE ARTHROSCOPY W/ ACL RECONSTRUCTION Right   . MENISCUS REPAIR Right    x 2  . MYOMECTOMY      Prior to Admission medications   Medication Sig Start Date End Date Taking? Authorizing Provider  escitalopram (LEXAPRO) 5 MG tablet Take 1 tablet (5 mg total) by mouth at bedtime. 08/09/17   Mar Daring, PA-C  loratadine (CLARITIN) 10 MG tablet Take 10 mg by mouth daily.    [provider]  pantoprazole (PROTONIX) 40 MG tablet TAKE 1 TABLET (40 MG TOTAL) BY MOUTH DAILY. 08/03/17   Mar Daring, PA-C  Probiotic Product (NATRUL PROBIOTIC) CAPS Take 3 capsules by mouth every evening.    [provider]  scopolamine (TRANSDERM-SCOP, 1.5 MG,) 1 MG/3DAYS Place 1 patch (1.5 mg total) onto the skin every 3 (three) days. Patient not taking: Reported on 08/09/2017 05/15/17   Carmon Ginsberg, PA  spironolactone (ALDACTONE) 50 MG  tablet Take 1 tablet (50 mg total) by mouth daily. 06/08/17   Mar Daring, PA-C    Allergies as of 09/25/2017  . (No Known Allergies)    Family History  Problem Relation Age of Onset  . Heart disease Mother        valvular vardiomyopathy  . Diabetes Mother   . Deep vein thrombosis Mother   . Other Mother        APLS-connective tissue disorder  . Breast cancer Mother 3       Had left mastectomy  . Heart disease Maternal Grandmother        heart failure, sick sinus  . Colon cancer Neg Hx     Social History   Social History  . Marital status: Single    Spouse name: N/A  . Number of children: 0  . Years of education: N/A   Occupational History  . [employer]    Social History Main Topics  . Smoking status: Never Smoker  . Smokeless tobacco: Never Used  . Alcohol use No  . Drug use: No  . Sexual activity: Not on file   Other Topics Concern  . Not on file   Social History Narrative   Daily caffeine     Review of Systems: See HPI, otherwise negative ROS  Physical Exam: There were no vitals taken for this visit. General:   Alert,  pleasant and cooperative in NAD Head:  Normocephalic and atraumatic. Neck:  Supple; no masses or thyromegaly. Lungs:  Clear throughout to auscultation.    Heart:  Regular rate and rhythm. Abdomen:  Soft, nontender and nondistended. Normal bowel sounds, without guarding, and without rebound.   Neurologic:  Alert and  oriented x4;  grossly normal neurologically.  Impression/Plan: Audrey Blair is here for an colonoscopy to be performed for screening colonoscopy  Risks, benefits, limitations, and alternatives regarding  colonoscopy have been reviewed with the patient.  Questions have been answered.  All parties agreeable.   Sherri Sear, MD  10/04/2017, 1:56 PM

## 2017-10-04 NOTE — Anesthesia Post-op Follow-up Note (Signed)
Anesthesia QCDR form completed.        

## 2017-10-04 NOTE — Anesthesia Procedure Notes (Signed)
Date/Time: 10/04/2017 2:39 PM Performed by: Darlyne Russian Pre-anesthesia Checklist: Patient identified, Emergency Drugs available, Suction available, Patient being monitored and Timeout performed Patient Re-evaluated:Patient Re-evaluated prior to induction Oxygen Delivery Method: Nasal cannula Placement Confirmation: positive ETCO2

## 2017-10-04 NOTE — Anesthesia Preprocedure Evaluation (Signed)
Anesthesia Evaluation  Patient identified by MRN, date of birth, ID band Patient awake    Reviewed: Allergy & Precautions, H&P , NPO status , Patient's Chart, lab work & pertinent test results, reviewed documented beta blocker date and time   History of Anesthesia Complications (+) PONV and history of anesthetic complications  Airway Mallampati: II   Neck ROM: full    Dental  (+) Poor Dentition, Teeth Intact   Pulmonary neg pulmonary ROS, shortness of breath, sleep apnea ,    Pulmonary exam normal        Cardiovascular negative cardio ROS Normal cardiovascular exam Rhythm:regular Rate:Normal     Neuro/Psych  Headaches, PSYCHIATRIC DISORDERS negative neurological ROS  negative psych ROS   GI/Hepatic negative GI ROS, Neg liver ROS, PUD, GERD  Medicated,  Endo/Other  negative endocrine ROSMorbid obesity  Renal/GU negative Renal ROS  negative genitourinary   Musculoskeletal   Abdominal   Peds  Hematology negative hematology ROS (+)   Anesthesia Other Findings Past Medical History: No date: Allergy No date: Gastric ulcer No date: GERD (gastroesophageal reflux disease) No date: Headache     Comment:  Migraine No date: Obesity (BMI 30-39.9) No date: PONV (postoperative nausea and vomiting) No date: Shortness of breath dyspnea Sept 2012: Sleep apnea syndrome     Comment:  diagnosed with positive sleep study  Past Surgical History: No date: ABDOMINAL HYSTERECTOMY 07/01/2010: BREAST BIOPSY; Left     Comment:  [place] bx with clip. fibroadenoma 2015: BREAST CYST ASPIRATION; Right     Comment:  6-7:00 cyst 11/09/2014: ESOPHAGOGASTRODUODENOSCOPY; N/A     Comment:  Procedure: ESOPHAGOGASTRODUODENOSCOPY (EGD);  Surgeon:               Inda Castle, MD;  Location: [facility];  Service:               Endoscopy;  Laterality: N/A; No date: KNEE ARTHROSCOPY W/ ACL RECONSTRUCTION; Right No date: MENISCUS REPAIR; Right  Comment:  x 2 No date: MYOMECTOMY BMI    Body Mass Index:  30.67 kg/m     Reproductive/Obstetrics negative OB ROS                             Anesthesia Physical Anesthesia Plan  ASA: III  Anesthesia Plan: General   Post-op Pain Management:    Induction:   PONV Risk Score and Plan:   Airway Management Planned:   Additional Equipment:   Intra-op Plan:   Post-operative Plan:   Informed Consent: I have reviewed the patients History and Physical, chart, labs and discussed the procedure including the risks, benefits and alternatives for the proposed anesthesia with the patient or authorized representative who has indicated his/her understanding and acceptance.   Dental Advisory Given  Plan Discussed with: CRNA  Anesthesia Plan Comments:         Anesthesia Quick Evaluation

## 2017-10-04 NOTE — Op Note (Signed)
[facility] Gastroenterology Patient Name: Lateefa Crosby Procedure Date: 10/04/2017 2:35 PM MRN: [id number] Account #: [id number] Date of Birth: 10-03-1966 Admit Type: Outpatient Age: 51 Room: [facility] ENDO ROOM 3 Gender: Female Note Status: Finalized Procedure:            Colonoscopy Indications:          Screening for colorectal malignant neoplasm, This is                        the patient's first colonoscopy Providers:            Lin Landsman MD, MD Referring MD:         Mar Daring (Referring MD) Medicines:            Monitored Anesthesia Care Complications:        No immediate complications. Estimated blood loss: None. Procedure:            Pre-Anesthesia Assessment:                       - Prior to the procedure, a History and Physical was                        performed, and patient medications and allergies were                        reviewed. The patient is competent. The risks and                        benefits of the procedure and the sedation options and                        risks were discussed with the patient. All questions                        were answered and informed consent was obtained.                        Patient identification and proposed procedure were                        verified by the physician, the nurse, the                        anesthesiologist, the anesthetist and the technician in                        the pre-procedure area in the procedure room. Mental                        Status Examination: alert and oriented. Airway                        Examination: normal oropharyngeal airway and neck                        mobility. Respiratory Examination: clear to                        auscultation. CV Examination: normal. Prophylactic  Antibiotics: The patient does not require prophylactic                        antibiotics. Prior Anticoagulants: The patient has     taken no previous anticoagulant or antiplatelet agents.                        ASA Grade Assessment: II - A patient with mild systemic                        disease. After reviewing the risks and benefits, the                        patient was deemed in satisfactory condition to undergo                        the procedure. The anesthesia plan was to use monitored                        anesthesia care (MAC). Immediately prior to                        administration of medications, the patient was                        re-assessed for adequacy to receive sedatives. The                        heart rate, respiratory rate, oxygen saturations, blood                        pressure, adequacy of pulmonary ventilation, and                        response to care were monitored throughout the                        procedure. The physical status of the patient was                        re-assessed after the procedure.                       After obtaining informed consent, the colonoscope was                        passed under direct vision. Throughout the procedure,                        the patient's blood pressure, pulse, and oxygen                        saturations were monitored continuously. The                        Colonoscope was introduced through the anus and                        advanced to the the cecum, identified by appendiceal  orifice and ileocecal valve. The colonoscopy was                        performed without difficulty. The patient tolerated the                        procedure well. The quality of the bowel preparation                        was evaluated using the BBPS [facility] Bowel Preparation                        Scale) with scores of: Right Colon = 3, Transverse                        Colon = 3 and Left Colon = 3 (entire mucosa seen well                        with no residual staining, small fragments of stool or                         opaque liquid). The total BBPS score equals 9. Findings:      The perianal and digital rectal examinations were normal. Pertinent       negatives include normal sphincter tone and no palpable rectal lesions.      Two sessile polyps were found in the ascending colon and cecum. The       polyps were diminutive in size. These polyps were removed with a cold       biopsy forceps. Resection and retrieval were complete.      The entire examined colon appeared normal on direct and retroflexion       views. Impression:           - Two diminutive polyps in the ascending colon and in                        the cecum, removed with a cold biopsy forceps. Resected                        and retrieved.                       - The entire examined colon is normal on direct and                        retroflexion views. Recommendation:       - Discharge patient to home.                       - Resume regular diet today.                       - Continue present medications.                       - Await pathology results.                       - Repeat colonoscopy in 5 years for surveillance based  on pathology results. Procedure Code(s):    --- Professional ---                       [phone], Colonoscopy, flexible; with biopsy, single or                        multiple Diagnosis Code(s):    --- Professional ---                       Z12.11, Encounter for screening for malignant neoplasm                        of colon                       D12.2, Benign neoplasm of ascending colon                       D12.0, Benign neoplasm of cecum CPT copyright 2016 American Medical Association. All rights reserved. The codes documented in this report are preliminary and upon coder review may  be revised to meet current compliance requirements. Dr. Ulyess Mort Lin Landsman MD, MD 10/04/2017 3:09:29 PM This report has been signed electronically. Number of Addenda: 0 Note Initiated  On: 10/04/2017 2:35 PM Scope Withdrawal Time: 0 hours 12 minutes 35 seconds  Total Procedure Duration: 0 hours 21 minutes 34 seconds       [facility]

## 2017-10-04 NOTE — Transfer of Care (Signed)
Immediate Anesthesia Transfer of Care Note  Patient: Audrey Blair  Procedure(s) Performed: COLONOSCOPY WITH PROPOFOL (N/A )  Patient Location: Endoscopy Unit  Anesthesia Type:General  Level of Consciousness: awake  Airway & Oxygen Therapy: Patient connected to nasal cannula oxygen  Post-op Assessment: Post -op Vital signs reviewed and stable  Post vital signs: stable  Last Vitals:  Vitals:   10/04/17 1411 10/04/17 1510  BP: 126/75 99/63  Pulse: 74 69  Resp:  17  Temp: 36.9 C (!) (P) 36.3 C  SpO2: 100% 97%    Last Pain:  Vitals:   10/04/17 1510  TempSrc: (P) Tympanic      Patients Stated Pain Goal: 0 (46/80/32 1224)  Complications: No apparent anesthesia complications

## 2017-10-05 ENCOUNTER — Encounter: Payer: Self-pay | Admitting: Gastroenterology

## 2017-10-09 LAB — SURGICAL PATHOLOGY

## 2017-10-16 NOTE — Anesthesia Postprocedure Evaluation (Signed)
Anesthesia Post Note  Patient: Audrey Blair  Procedure(s) Performed: COLONOSCOPY WITH PROPOFOL (N/A )  Patient location during evaluation: PACU Anesthesia Type: General Level of consciousness: awake and alert Pain management: pain level controlled Vital Signs Assessment: post-procedure vital signs reviewed and stable Respiratory status: spontaneous breathing, nonlabored ventilation, respiratory function stable and patient connected to nasal cannula oxygen Cardiovascular status: blood pressure returned to baseline and stable Postop Assessment: no apparent nausea or vomiting Anesthetic complications: no     Last Vitals:  Vitals:   10/04/17 1530 10/04/17 1540  BP: 123/81 119/76  Pulse: 71 64  Resp: 14 12  Temp:    SpO2: 100% 98%    Last Pain:  Vitals:   10/04/17 1510  TempSrc: Tympanic                 Molli Barrows

## 2017-10-29 ENCOUNTER — Ambulatory Visit: Payer: BLUE CROSS/BLUE SHIELD | Admitting: Physician Assistant

## 2017-10-29 ENCOUNTER — Ambulatory Visit
Admission: RE | Admit: 2017-10-29 | Discharge: 2017-10-29 | Disposition: A | Discharge status: Home / Self Care | Payer: BLUE CROSS/BLUE SHIELD | Source: Ambulatory Visit | Attending: Physician Assistant | Admitting: Physician Assistant

## 2017-10-29 ENCOUNTER — Encounter: Payer: Self-pay | Admitting: Physician Assistant

## 2017-10-29 VITALS — BP 90/60 | HR 88 | Temp 97.7°F | Resp 16 | Wt 198.0 lb

## 2017-10-29 DIAGNOSIS — M50122 Cervical disc disorder at C5-C6 level with radiculopathy: Secondary | ICD-10-CM | POA: Insufficient documentation

## 2017-10-29 DIAGNOSIS — M5412 Radiculopathy, cervical region: Secondary | ICD-10-CM

## 2017-10-29 DIAGNOSIS — M25511 Pain in right shoulder: Secondary | ICD-10-CM

## 2017-10-29 DIAGNOSIS — M5030 Other cervical disc degeneration, unspecified cervical region: Secondary | ICD-10-CM

## 2017-10-29 MED ORDER — CYCLOBENZAPRINE HCL 5 MG PO TABS: 5.0000 mg | ORAL_TABLET | Freq: Three times a day (TID) | ORAL | 1 refills | Status: DC | PRN

## 2017-10-29 MED ORDER — METHYLPREDNISOLONE 4 MG PO TBPK: ORAL_TABLET | ORAL | 0 refills | Status: DC

## 2017-10-29 NOTE — Progress Notes (Signed)
Patient: Audrey Blair Female    DOB: 07/06/1966   51 y.o.   MRN: [id number] Visit Date: 10/29/2017  Today's Provider: Mar Daring, PA-C   Chief Complaint  Patient presents with  . Shoulder Pain   Subjective:    HPI Patient here today C/O right shoulder/blade pain radiating to arm and fingers since last Thursday. Patient reports symptoms are worsening. Patient reports taking 800 mg of Advil every 6 hours, and Flexeril last night and this morning, reports mild relief. Patient denies any new injuries. Patient does have a history of degenerative changes at multiple levels that have caused bone spurs that may be periodically pushing on nerve roots as they come out from the spinal cord. Patient had MRI on 10/31/2014.      No Known Allergies   Current Outpatient Medications:  .  loratadine (CLARITIN) 10 MG tablet, Take 10 mg by mouth daily., Disp: , Rfl:  .  pantoprazole (PROTONIX) 40 MG tablet, TAKE 1 TABLET (40 MG TOTAL) BY MOUTH DAILY., Disp: 90 tablet, Rfl: 0 .  Probiotic Product (NATRUL PROBIOTIC) CAPS, Take 3 capsules by mouth every evening., Disp: , Rfl:   Review of Systems  HENT: Negative.   Respiratory: Negative.   Cardiovascular: Negative.   Musculoskeletal: Positive for arthralgias, neck pain and neck stiffness.  Neurological: Positive for weakness and numbness.    Social History   Tobacco Use  . Smoking status: Never Smoker  . Smokeless tobacco: Never Used  Substance Use Topics  . Alcohol use: No   Objective:   BP 90/60 (BP Location: Left Arm, Patient Position: Sitting, Cuff Size: Large)   Pulse 88   Temp 97.7 F (36.5 C) (Oral)   Resp 16   Wt 198 lb (89.8 kg)   SpO2 98%   BMI 31.96 kg/m  Vitals:   10/29/17 1004  BP: 90/60  Pulse: 88  Resp: 16  Temp: 97.7 F (36.5 C)  TempSrc: Oral  SpO2: 98%  Weight: 198 lb (89.8 kg)     Physical Exam  Constitutional: She appears well-developed and well-nourished. No distress.  Neck: Normal  range of motion. Neck supple.  Cardiovascular: Normal rate, regular rhythm and normal heart sounds. Exam reveals no gallop and no friction rub.  No murmur heard. Pulmonary/Chest: Effort normal and breath sounds normal. No respiratory distress. She has no wheezes. She has no rales.  Musculoskeletal:       Right shoulder: She exhibits decreased range of motion (bothered most by external rotation at side and at 90 degree abduction, abduction caused pain, and forward shoulder flexion caused pain), tenderness, pain and spasm. She exhibits no bony tenderness, no swelling and no crepitus. Decreased strength: strength not tested due to acuity with pain during AROM and PROM.       Cervical back: She exhibits decreased range of motion, tenderness and spasm. She exhibits no bony tenderness.  Skin: She is not diaphoretic.  Vitals reviewed.     Assessment & Plan:     1. DDD (degenerative disc disease), cervical H/O this, unsure if this is main cause but do suspect correlation since patient is having radiculopathy in C6 dermatome pattern. Will get imaging as below since last imaging was done in 2015. Patient will be given flexeril and medrol dose pak as below. Advised to use tylenol until she completes medrol dose pak then can restart IBU following completion. Continue using TENS unit while her brother is in town. Will also refer to orthopedics  due to acuity of symptoms. She is to call if symptoms worsen. I will contact her with imaging results.  - DG Cervical Spine Complete; Future - DG Shoulder Right; Future - cyclobenzaprine (FLEXERIL) 5 MG tablet; Take 1 tablet (5 mg total) 3 (three) times daily as needed by mouth for muscle spasms.  Dispense: 30 tablet; Refill: 1 - methylPREDNISolone (MEDROL) 4 MG TBPK tablet; 6 day taper  Dispense: 21 tablet; Refill: 0 - Ambulatory referral to Orthopedic Surgery  2. Cervical radiculopathy See above medical treatment plan. - DG Cervical Spine Complete; Future - DG  Shoulder Right; Future - cyclobenzaprine (FLEXERIL) 5 MG tablet; Take 1 tablet (5 mg total) 3 (three) times daily as needed by mouth for muscle spasms.  Dispense: 30 tablet; Refill: 1 - methylPREDNISolone (MEDROL) 4 MG TBPK tablet; 6 day taper  Dispense: 21 tablet; Refill: 0 - Ambulatory referral to Orthopedic Surgery  3. Acute pain of right shoulder See above medical treatment plan. - DG Cervical Spine Complete; Future - DG Shoulder Right; Future - cyclobenzaprine (FLEXERIL) 5 MG tablet; Take 1 tablet (5 mg total) 3 (three) times daily as needed by mouth for muscle spasms.  Dispense: 30 tablet; Refill: 1 - methylPREDNISolone (MEDROL) 4 MG TBPK tablet; 6 day taper  Dispense: 21 tablet; Refill: 0 - Ambulatory referral to [place], PA-C  [facility]

## 2017-11-01 ENCOUNTER — Ambulatory Visit: Payer: BLUE CROSS/BLUE SHIELD | Admitting: Physician Assistant

## 2017-11-06 ENCOUNTER — Encounter: Payer: Self-pay | Admitting: Physician Assistant

## 2017-11-07 ENCOUNTER — Other Ambulatory Visit: Payer: Self-pay | Admitting: Physician Assistant

## 2017-11-07 DIAGNOSIS — K2100 Gastro-esophageal reflux disease with esophagitis, without bleeding: Secondary | ICD-10-CM

## 2017-11-29 ENCOUNTER — Encounter: Payer: Self-pay | Admitting: Physician Assistant

## 2017-11-29 DIAGNOSIS — M5020 Other cervical disc displacement, unspecified cervical region: Secondary | ICD-10-CM

## 2017-11-29 DIAGNOSIS — T753XXA Motion sickness, initial encounter: Secondary | ICD-10-CM

## 2017-11-29 MED ORDER — GABAPENTIN 100 MG PO CAPS: 100.0000 mg | ORAL_CAPSULE | Freq: Every day | ORAL | 0 refills | Status: DC

## 2017-11-29 MED ORDER — SCOPOLAMINE 1 MG/3DAYS TD PT72: 1.0000 | MEDICATED_PATCH | TRANSDERMAL | 0 refills | Status: DC

## 2017-11-29 MED ORDER — BACLOFEN 10 MG PO TABS: 10.0000 mg | ORAL_TABLET | Freq: Two times a day (BID) | ORAL | 0 refills | Status: DC

## 2017-11-29 MED ORDER — NAPROXEN 500 MG PO TABS: 500.0000 mg | ORAL_TABLET | Freq: Two times a day (BID) | ORAL | 0 refills | Status: DC

## 2017-12-27 ENCOUNTER — Other Ambulatory Visit: Payer: Self-pay | Admitting: Physician Assistant

## 2017-12-27 DIAGNOSIS — M5020 Other cervical disc displacement, unspecified cervical region: Secondary | ICD-10-CM

## 2018-01-23 ENCOUNTER — Other Ambulatory Visit: Payer: Self-pay | Admitting: Physician Assistant

## 2018-01-23 DIAGNOSIS — M5020 Other cervical disc displacement, unspecified cervical region: Secondary | ICD-10-CM

## 2018-01-31 ENCOUNTER — Other Ambulatory Visit: Payer: Self-pay | Admitting: Physician Assistant

## 2018-01-31 DIAGNOSIS — M5020 Other cervical disc displacement, unspecified cervical region: Secondary | ICD-10-CM

## 2018-02-05 ENCOUNTER — Other Ambulatory Visit: Payer: Self-pay | Admitting: Physician Assistant

## 2018-02-05 DIAGNOSIS — K2100 Gastro-esophageal reflux disease with esophagitis, without bleeding: Secondary | ICD-10-CM

## 2018-02-08 ENCOUNTER — Ambulatory Visit: Payer: BLUE CROSS/BLUE SHIELD | Admitting: Physician Assistant

## 2018-02-08 ENCOUNTER — Encounter: Payer: Self-pay | Admitting: Physician Assistant

## 2018-02-08 VITALS — BP 108/66 | HR 88 | Temp 98.1°F | Resp 14 | Wt 205.0 lb

## 2018-02-08 DIAGNOSIS — J40 Bronchitis, not specified as acute or chronic: Secondary | ICD-10-CM | POA: Diagnosis not present

## 2018-02-08 DIAGNOSIS — J0100 Acute maxillary sinusitis, unspecified: Secondary | ICD-10-CM | POA: Diagnosis not present

## 2018-02-08 MED ORDER — BENZONATATE 200 MG PO CAPS: 200.0000 mg | ORAL_CAPSULE | Freq: Three times a day (TID) | ORAL | 0 refills | Status: DC | PRN

## 2018-02-08 MED ORDER — PREDNISONE 10 MG (21) PO TBPK: ORAL_TABLET | ORAL | 0 refills | Status: DC

## 2018-02-08 MED ORDER — DOXYCYCLINE HYCLATE 100 MG PO TABS: 100.0000 mg | ORAL_TABLET | Freq: Two times a day (BID) | ORAL | 0 refills | Status: DC

## 2018-02-08 NOTE — Patient Instructions (Signed)

## 2018-02-08 NOTE — Progress Notes (Signed)
Patient: Audrey Blair Female    DOB: 1966-11-28   52 y.o.   MRN: [id number] Visit Date: 02/08/2018  Today's Provider: Mar Daring, PA-C   Chief Complaint  Patient presents with  . Cough   Subjective:    HPI Pt is here today for what she thinks may be bronchitis. She reports that she started feeling bad last Wednesday she had body aches, cough, dizziness, lightheaded, almost felt like she may pass out (she has not felt like this since), sinus congestion, chest congestion, wheezing, and shortness of breath. She reports that she has surgery next week and wants to make sure she is well for surgery. She is having a inferior cervical replacement.      No Known Allergies   Current Outpatient Medications:  .  pantoprazole (PROTONIX) 40 MG tablet, TAKE 1 TABLET BY MOUTH EVERY DAY, Disp: 90 tablet, Rfl: 1 .  Probiotic Product (NATRUL PROBIOTIC) CAPS, Take 3 capsules by mouth every evening., Disp: , Rfl:  .  baclofen (LIORESAL) 10 MG tablet, TAKE 1 TABLET BY MOUTH TWICE A DAY (Patient not taking: Reported on 02/08/2018), Disp: 60 tablet, Rfl: 0 .  gabapentin (NEURONTIN) 100 MG capsule, Take 1 capsule (100 mg total) by mouth at bedtime. May increase by 1 tab weekly until max of 3 tabs PO at bedtime achieved (Patient not taking: Reported on 02/08/2018), Disp: 90 capsule, Rfl: 0 .  loratadine (CLARITIN) 10 MG tablet, Take 10 mg by mouth daily., Disp: , Rfl:  .  naproxen (NAPROSYN) 500 MG tablet, TAKE 1 TABLET (500 MG TOTAL) BY MOUTH 2 (TWO) TIMES DAILY WITH A MEAL. (Patient not taking: Reported on 02/08/2018), Disp: 60 tablet, Rfl: 5 .  scopolamine (TRANSDERM-SCOP) 1 MG/3DAYS, Place 1 patch (1.5 mg total) onto the skin every 3 (three) days. (Patient not taking: Reported on 02/08/2018), Disp: 4 patch, Rfl: 0  Review of Systems  Constitutional: Positive for fatigue.  HENT: Positive for congestion and rhinorrhea.   Eyes: Negative.   Respiratory: Positive for cough, shortness of breath  and wheezing.   Cardiovascular: Negative.   Gastrointestinal: Negative.   Endocrine: Negative.   Genitourinary: Negative.   Musculoskeletal: Positive for myalgias.  Skin: Negative.   Allergic/Immunologic: Negative.   Neurological: Positive for dizziness and light-headedness.  Hematological: Negative.   Psychiatric/Behavioral: Negative.     Social History   Tobacco Use  . Smoking status: Never Smoker  . Smokeless tobacco: Never Used  Substance Use Topics  . Alcohol use: No   Objective:   BP 108/66 (BP Location: Left Arm, Patient Position: Sitting, Cuff Size: Normal)   Pulse 88   Temp 98.1 F (36.7 C) (Oral)   Resp 14   Wt 205 lb (93 kg)   SpO2 96%   BMI 33.09 kg/m  Vitals:   02/08/18 0830  BP: 108/66  Pulse: 88  Resp: 14  Temp: 98.1 F (36.7 C)  TempSrc: Oral  SpO2: 96%  Weight: 205 lb (93 kg)     Physical Exam  Constitutional: She appears well-developed and well-nourished. No distress.  HENT:  Head: Normocephalic and atraumatic.  Right Ear: Hearing, tympanic membrane, external ear and ear canal normal.  Left Ear: Hearing, tympanic membrane, external ear and ear canal normal.  Nose: Nose normal.  Mouth/Throat: Uvula is midline, oropharynx is clear and moist and mucous membranes are normal. No oropharyngeal exudate.  Eyes: Conjunctivae are normal. Pupils are equal, round, and reactive to light. Right eye exhibits no discharge.  Left eye exhibits no discharge. No scleral icterus.  Neck: Normal range of motion. Neck supple. No tracheal deviation present. No thyromegaly present.  Cardiovascular: Normal rate, regular rhythm and normal heart sounds. Exam reveals no gallop and no friction rub.  No murmur heard. Pulmonary/Chest: Effort normal. No stridor. No respiratory distress. She has wheezes (mild expiratory throughout). She has no rales.  Lymphadenopathy:    She has no cervical adenopathy.  Skin: Skin is warm and dry. She is not diaphoretic.  Vitals  reviewed.      Assessment & Plan:     1. Bronchitis Worsening symptoms that have not responded to OTC medications. Will give Doxycycline as below. Will also give prednisone as below to optimize treatment. May continue Mucinex prn for congestion. Stay well hydrated and get plenty of rest. Call if no symptom improvement or if symptoms worsen. - doxycycline (VIBRA-TABS) 100 MG tablet; Take 1 tablet (100 mg total) by mouth 2 (two) times daily.  Dispense: 10 tablet; Refill: 0 - predniSONE (STERAPRED UNI-PAK 21 TAB) 10 MG (21) TBPK tablet; 6 day prednisone taper; take as directed on package instructions  Dispense: 21 tablet; Refill: 0 - benzonatate (TESSALON) 200 MG capsule; Take 1 capsule (200 mg total) by mouth 3 (three) times daily as needed for cough.  Dispense: 20 capsule; Refill: 0  2. Acute maxillary sinusitis, recurrence not specified See above medical treatment plan. - doxycycline (VIBRA-TABS) 100 MG tablet; Take 1 tablet (100 mg total) by mouth 2 (two) times daily.  Dispense: 10 tablet; Refill: 0 - predniSONE (STERAPRED UNI-PAK 21 TAB) 10 MG (21) TBPK tablet; 6 day prednisone taper; take as directed on package instructions  Dispense: 21 tablet; Refill: 0       Mar Daring, PA-C  [facility]

## 2018-03-07 ENCOUNTER — Encounter: Payer: Self-pay | Admitting: Family Medicine

## 2018-03-07 ENCOUNTER — Ambulatory Visit: Payer: BLUE CROSS/BLUE SHIELD | Admitting: Family Medicine

## 2018-03-07 VITALS — BP 120/84 | HR 74 | Temp 98.0°F | Resp 16 | Wt 207.0 lb

## 2018-03-07 DIAGNOSIS — K2100 Gastro-esophageal reflux disease with esophagitis, without bleeding: Secondary | ICD-10-CM

## 2018-03-07 DIAGNOSIS — Z4889 Encounter for other specified surgical aftercare: Secondary | ICD-10-CM

## 2018-03-07 MED ORDER — PANTOPRAZOLE SODIUM 40 MG PO TBEC: 40.0000 mg | DELAYED_RELEASE_TABLET | Freq: Every day | ORAL | 1 refills | Status: AC

## 2018-03-07 NOTE — Progress Notes (Signed)
Patient: Audrey Blair Female    DOB: 03/05/1966   52 y.o.   MRN: [id number] Visit Date: 03/07/2018  Today's Provider: Lavon Paganini, MD   I, Martha Clan, CMA, am acting as scribe for Lavon Paganini, MD.   Chief Complaint  Patient presents with  . 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 The would is open. Pt states the wound is sore and bleeds. Denies discharge, warmth, malodor.   She saw surgeon 3 days ago.  Wound was still partially covered by Dermabond at that time, and has worsened since then.  Pt is requesting a refill of Pantoprazole.   Allergies  Allergen Reactions  . Adhesive [Tape] Rash     Current Outpatient Medications:  .  Acetaminophen (TYLENOL EXTRA STRENGTH PO), Take by mouth., Disp: , Rfl:  .  cyclobenzaprine (FLEXERIL) 10 MG tablet, TAKE 1 TABLET BY MOUTH EVERY 8 HOURS AS NEEDED FOR MUSCLE SPASMS., Disp: , Rfl: 1 .  ibuprofen (ADVIL,MOTRIN) 200 MG tablet, Take 600 mg by mouth every 6 (six) hours as needed., Disp: , Rfl:  .  loratadine (CLARITIN) 10 MG tablet, Take 10 mg by mouth daily., Disp: , Rfl:  .  oxyCODONE-acetaminophen (PERCOCET/ROXICET) 5-325 MG tablet, Take 1-2 tablets by mouth every 4 (four) hours as needed. for pain, Disp: , Rfl: 0 .  pantoprazole (PROTONIX) 40 MG tablet, TAKE 1 TABLET BY MOUTH EVERY DAY, Disp: 90 tablet, Rfl: 1 .  Probiotic Product (PROBIOTIC ADVANCED PO), Take by mouth., Disp: , Rfl:   Review of Systems  Constitutional: Negative for activity change, appetite change, chills, diaphoresis, fatigue, fever and unexpected weight change.  Gastrointestinal: Positive for nausea. Negative for vomiting.  Skin: Positive for wound.    Social History   Tobacco Use  . Smoking status: Never Smoker  . Smokeless tobacco: Never Used  Substance Use Topics  . Alcohol use: No    Objective:   BP 120/84 (BP Location: Left Arm, Patient Position: Sitting, Cuff Size: Large)   Pulse 74   Temp 98 F (36.7 C) (Oral)   Resp 16   Wt 207 lb (93.9 kg)   SpO2 98%   BMI 33.41 kg/m  Vitals:   03/07/18 1314  BP: 120/84  Pulse: 74  Resp: 16  Temp: 98 F (36.7 C)  TempSrc: Oral  SpO2: 98%  Weight: 207 lb (93.9 kg)     Physical Exam  Constitutional: She is oriented to person, place, and time. She appears well-developed and well-nourished. No distress.  Cardiovascular: Normal rate, regular rhythm, normal heart sounds and intact distal pulses.  No murmur heard. Pulmonary/Chest: Effort normal and breath sounds normal. No respiratory distress. She has no wheezes. She has no rales.  Neurological: She is alert and oriented to person, place, and time.  Psychiatric: She has a normal mood and affect. Her behavior is normal.  Vitals reviewed.         Assessment & Plan:  1. Encounter for postoperative wound check - wound on anterior neck has opened superficially in central part with dermabond intact on rest of wound - there is scab formation in open part and it is difficult to reapproximate edges - no dermabond available in office either - advised patient to f/u with surgeon as she is concerned about how much this is going to scar -  surgeon may be able to clean out new tissue and reapproximate edges and suture/glue back together for another chance at proper healing   Return if symptoms worsen or fail to improve.   The entirety of the information documented in the History of Present Illness, Review of Systems and Physical Exam were personally obtained by me. Portions of this information were initially documented by Raquel Sarna Ratchford, CMA and reviewed by me for thoroughness and accuracy.    Virginia Crews, MD, MPH [facility] 03/07/2018 2:34 PM

## 2018-06-05 ENCOUNTER — Encounter: Payer: Self-pay | Admitting: Physician Assistant

## 2018-06-05 DIAGNOSIS — A09 Infectious gastroenteritis and colitis, unspecified: Secondary | ICD-10-CM

## 2018-06-05 DIAGNOSIS — T753XXA Motion sickness, initial encounter: Secondary | ICD-10-CM

## 2018-06-05 MED ORDER — SCOPOLAMINE 1 MG/3DAYS TD PT72: 1.0000 | MEDICATED_PATCH | TRANSDERMAL | 12 refills | Status: AC

## 2018-06-05 MED ORDER — CIPROFLOXACIN HCL 500 MG PO TABS: 500.0000 mg | ORAL_TABLET | Freq: Every day | ORAL | 0 refills | Status: AC

## 2018-08-09 IMAGING — CT CT NECK WO/W CM
4 of 10 series · 12 of 33 positions shown, 13 images · IV contrast (iopamidol)
Comparison: [HOSPITAL] Cervical spine MRI 10/31/2014

CLINICAL DATA: 50-year-old female with intermittent swelling under
the left ear for 6 months. Possible salivary gland stone.

EXAM:
CT NECK WITH AND WITHOUT CONTRAST
TECHNIQUE: Multidetector CT imaging of the neck was performed without and with
intravenous contrast.
CONTRAST:  75mL [id number]-044 IOPAMIDOL (M4MZEZ-044) INJECTION 61%

[Series 9: sag neck · sagittal · 0.49mm/px · 5 of 101 slices shown]
[im 17/101  bone]
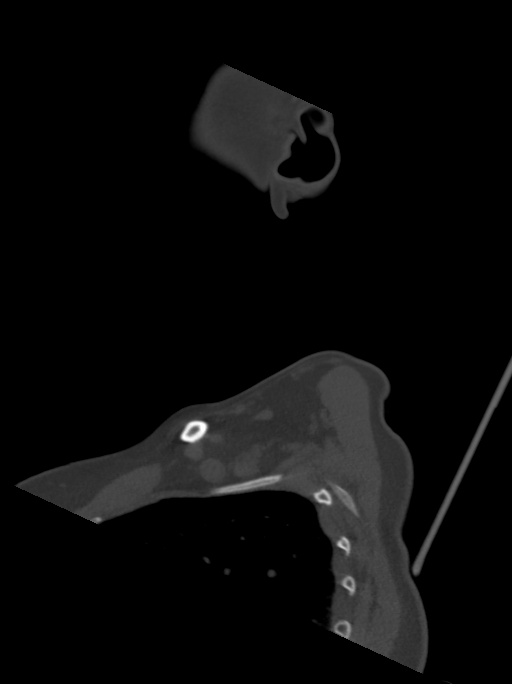
[im 34/101  bone]
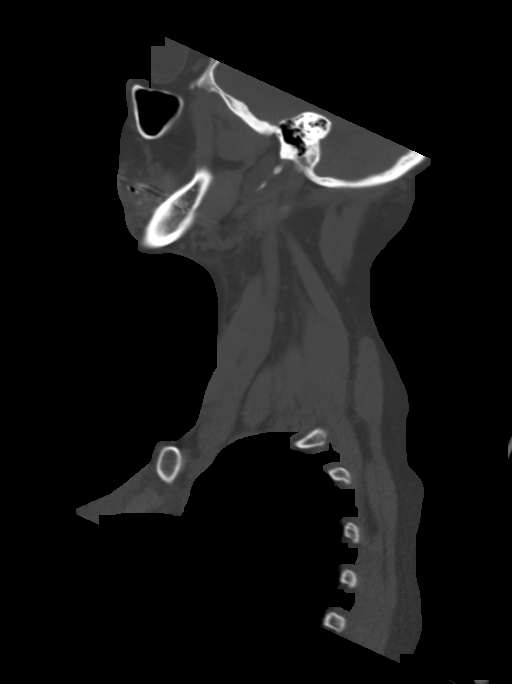
[im 51/101  bone]
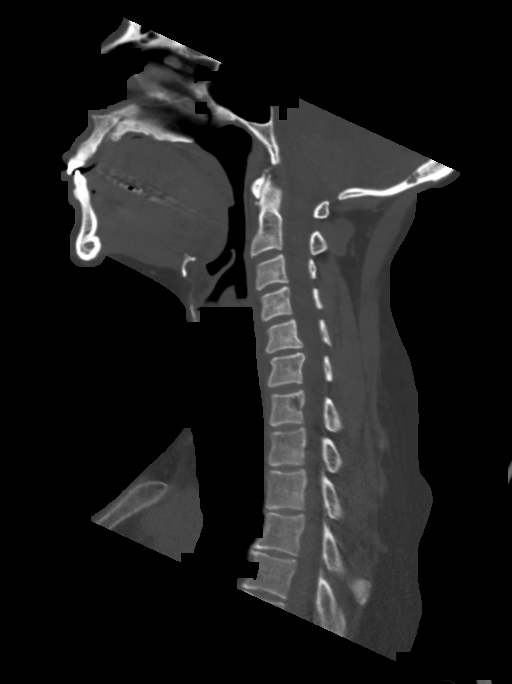
[im 67/101  bone]
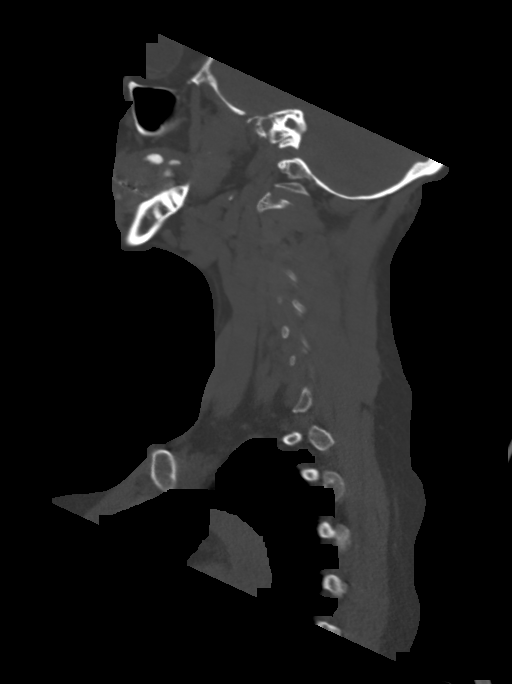
[im 84/101  bone]
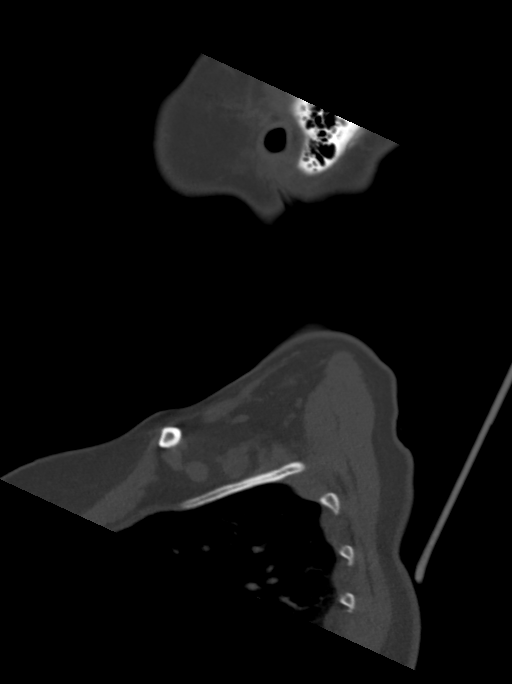

[Series 10: cor neck · coronal · 0.50mm/px · 3 of 110 slices shown]
[im 23/110  bone]
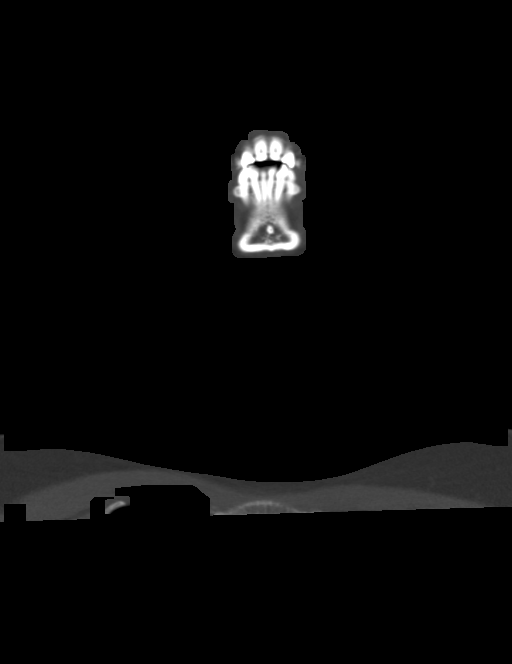
[im 49/110  bone]
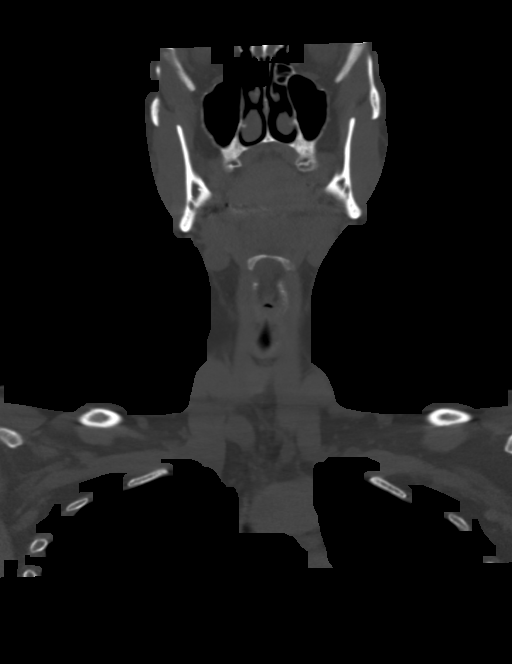
[im 75/110  bone]
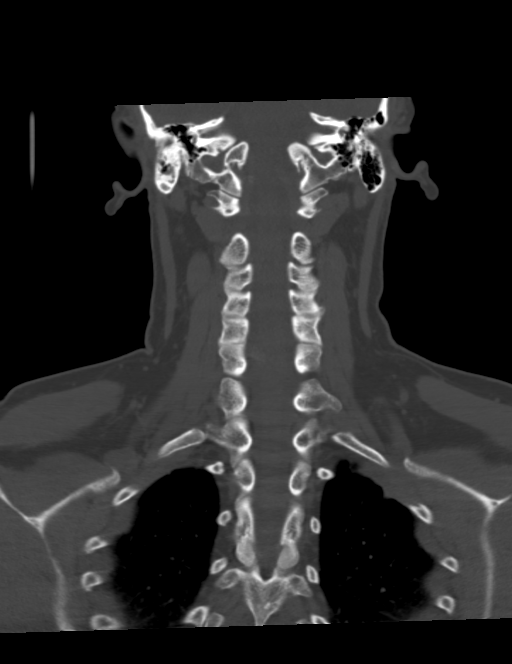

[Series 11: orthogonal ax · axial · 0.39mm/px · z∈[+355,+453]mm · 2 of 172 slices shown, 3 images (1 of 2)]
[im 58/172  soft-tissue]
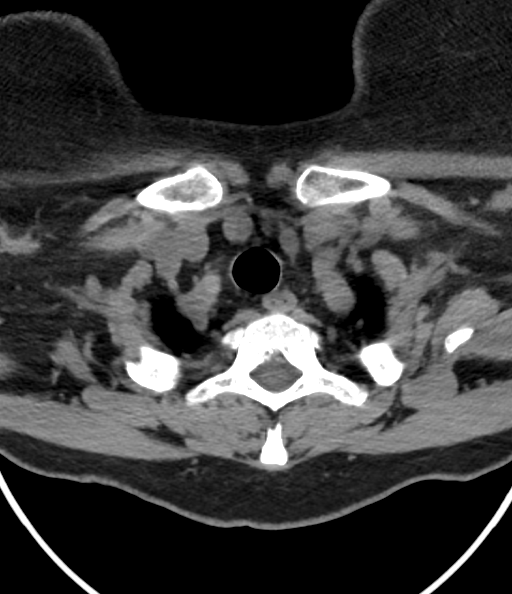
[im 58/172  bone]
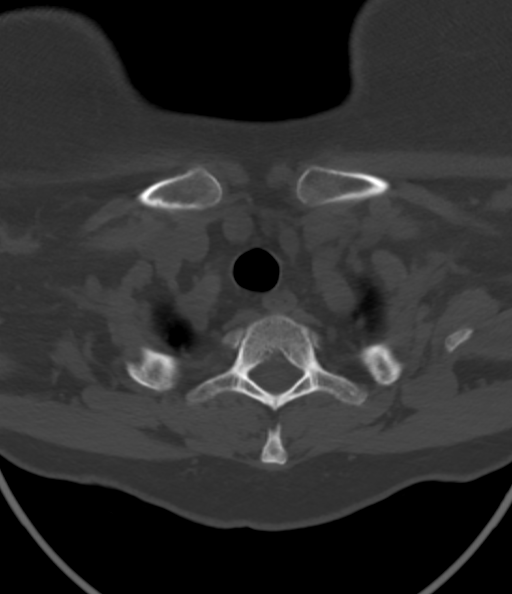
[im 115/172  bone]
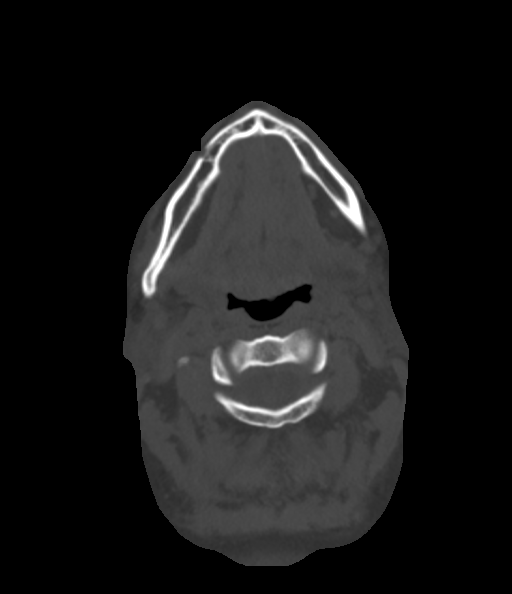

[Series 15: orthogonal ax · axial · 0.39mm/px · z∈[+357,+453]mm · 2 of 163 slices shown (2 of 2)]
[im 55/163  bone]
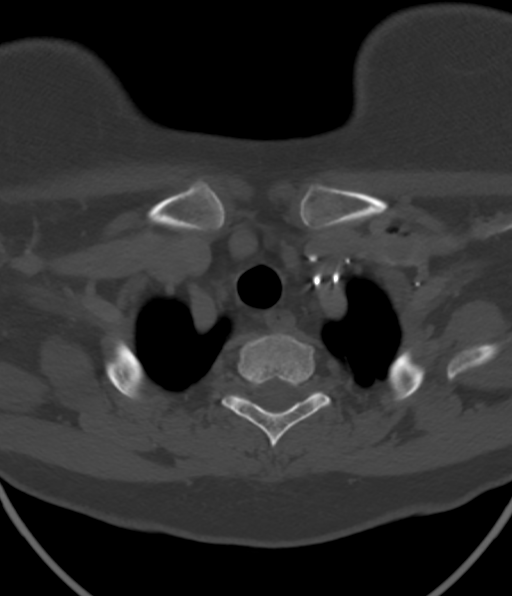
[im 109/163  bone]
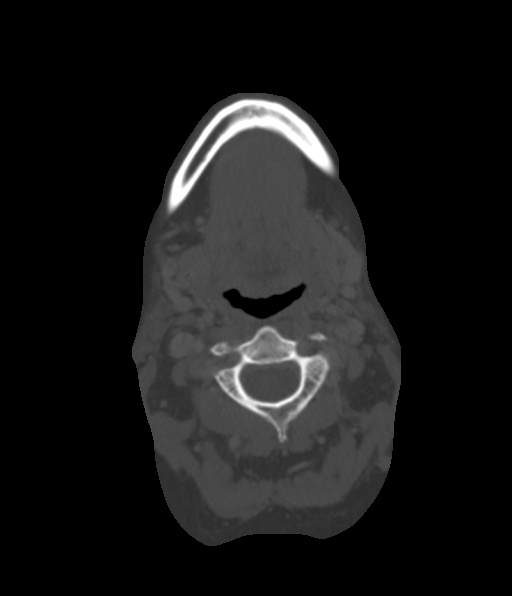

[12 of 33 positions shown; findings below may reference images not displayed]

FINDINGS: Pharynx and larynx: Normal larynx. Normal pharyngeal soft tissue
contours. Parapharyngeal and retropharyngeal spaces appear normal.

Salivary glands: Incidental bulky torus mandibularis. The sublingual
space soft tissues appear normal. The submandibular glands are
symmetric and normal. No sublingual or submandibular sialolithiasis
identified.

The bilateral parotid glands also appear symmetric and within normal
limits. No parotid duct enlargement. No parotid sialolithiasis
identified.

Thyroid: 8 mm left lobe hypodense thyroid nodule does not meet
consensus criteria for ultrasound follow-up. Remaining thyroid
parenchyma appears homogeneous.

Lymph nodes: No cervical lymphadenopathy.

There are small/normal bilateral level 5 lymph nodes, which are
slightly more numerous on the left including just posterior to the
left mastoid tip on series 5, image 21. These nodes appear stable
since the 3182 MRI. In the contralateral right posterior neck soft
tissues there is a chronic dystrophic calcification (series 5, image
26), also stable.

Vascular: Major vascular structures in the neck and at the skullbase
are patent. No significant cervical carotid atherosclerosis.

Limited intracranial: Negative.

Visualized orbits: Negative.

Mastoids and visualized paranasal sinuses: Clear aside from small
anterior maxillary mucous retention cysts. Asymmetric sclerosis of
the right mastoid air cells (series 4, image 11), but the remaining
air cells are clear.

Skeleton: Torus mandibularis. Occasional disc bulging and endplate
spurring in the cervical spine. No acute osseous abnormality
identified.

Upper chest: Normal visible lung apices. Normal visualized superior
mediastinum.
IMPRESSION: 1. No explanation for the stated symptoms. Negative for
sialolithiasis, sialadenitis, neck mass, or lymphadenopathy.
2. Unremarkable Neck CT.

## 2018-09-25 IMAGING — CR DG SHOULDER 2+V*R*
1 series · 3 of 3 positions shown · non-contrast
Comparison: None.

CLINICAL DATA: Right shoulder pain radiating down right arm.

EXAM:
RIGHT SHOULDER - 2+ VIEW

[Series 1: dg shoulder right · 0.14mm/px · 3 of 3 slices shown]
[im 1/3]
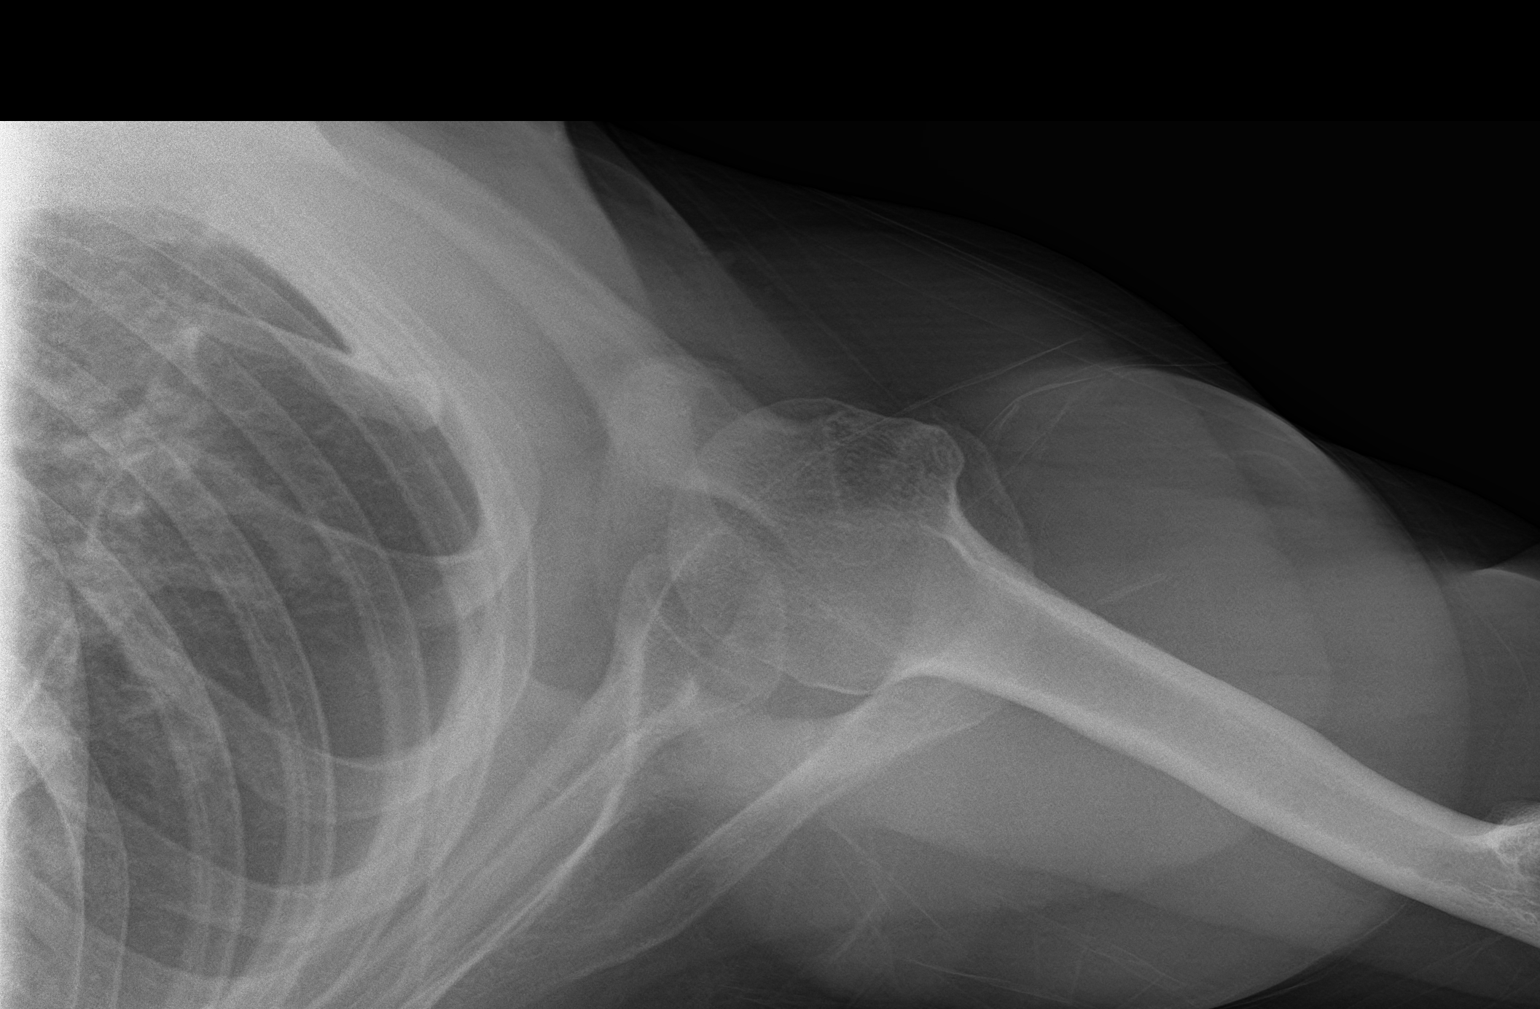
[im 2/3]
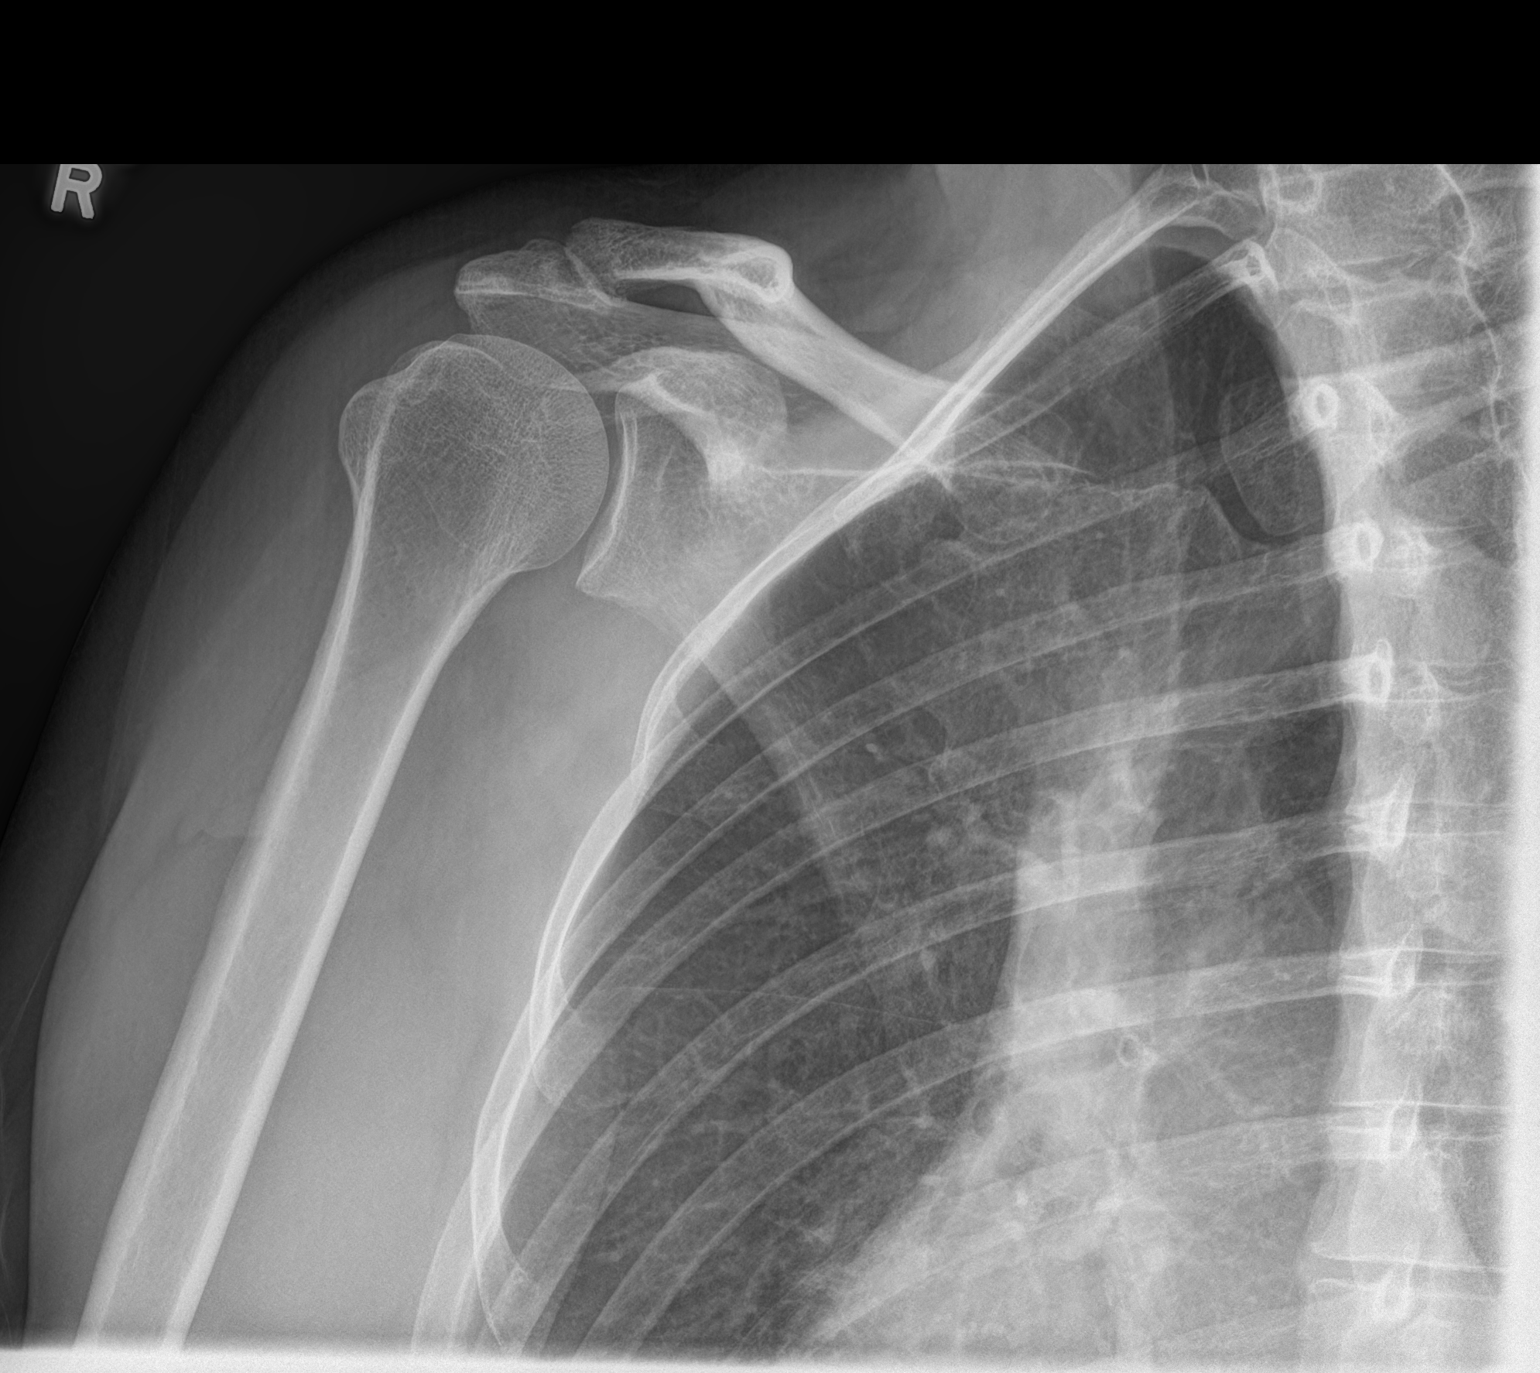
[im 3/3]
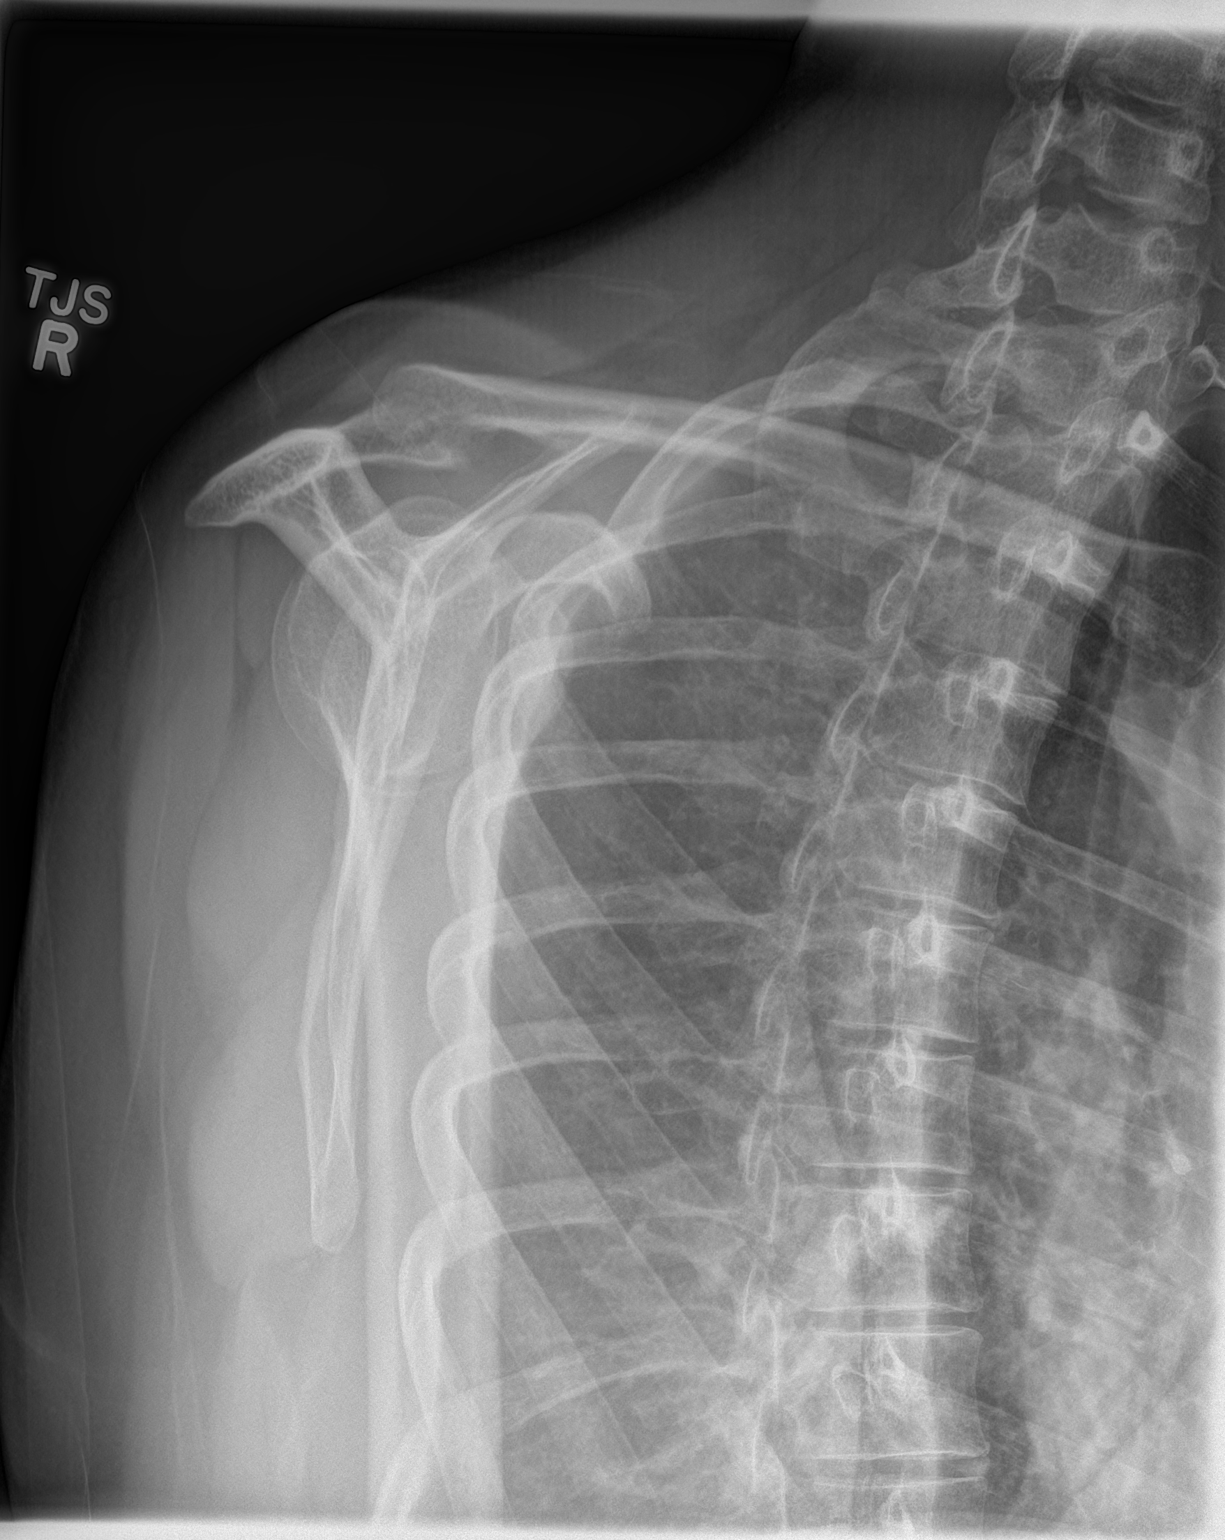

[3 of 3 positions shown; findings below may reference images not displayed]

FINDINGS: There is no evidence of fracture or dislocation. There is no
evidence of arthropathy or other focal bone abnormality. Soft
tissues are unremarkable.
IMPRESSION: No acute osseous abnormality.

## 2018-09-25 IMAGING — CR DG CERVICAL SPINE COMPLETE 4+V
1 series · 7 of 7 positions shown · non-contrast
Comparison: None.

CLINICAL DATA: Right-sided neck pain.

EXAM:
CERVICAL SPINE - COMPLETE 4+ VIEW

[Series 1: dg cervical spine complete · 0.14mm/px · 7 of 7 slices shown]
[im 1/7]
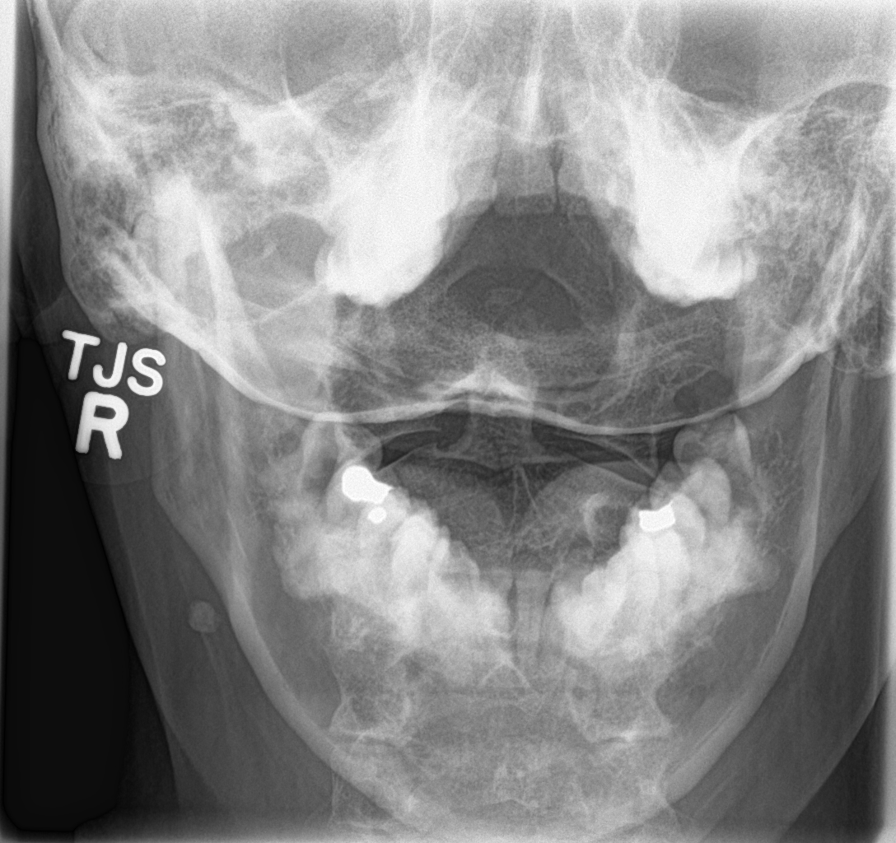
[im 2/7]
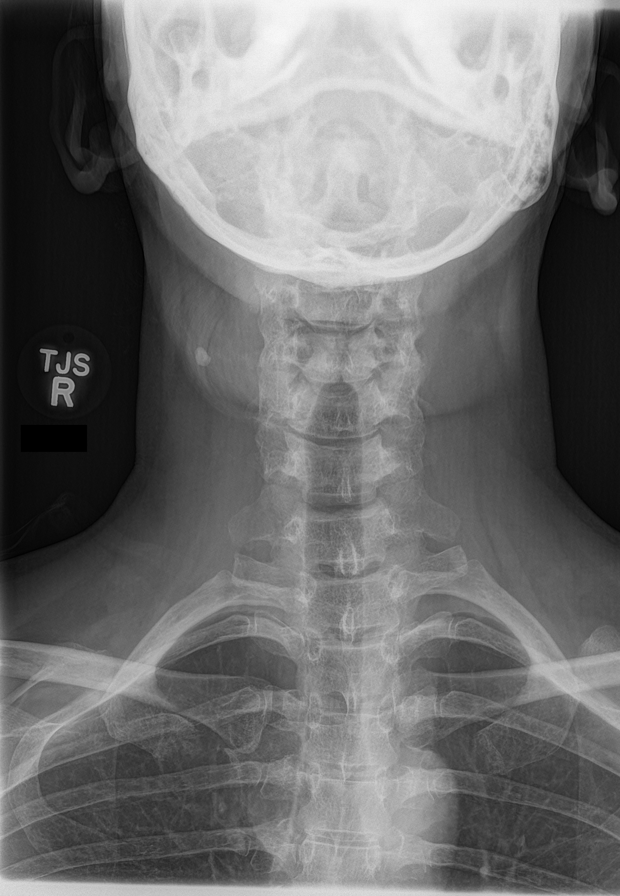
[im 3/7]
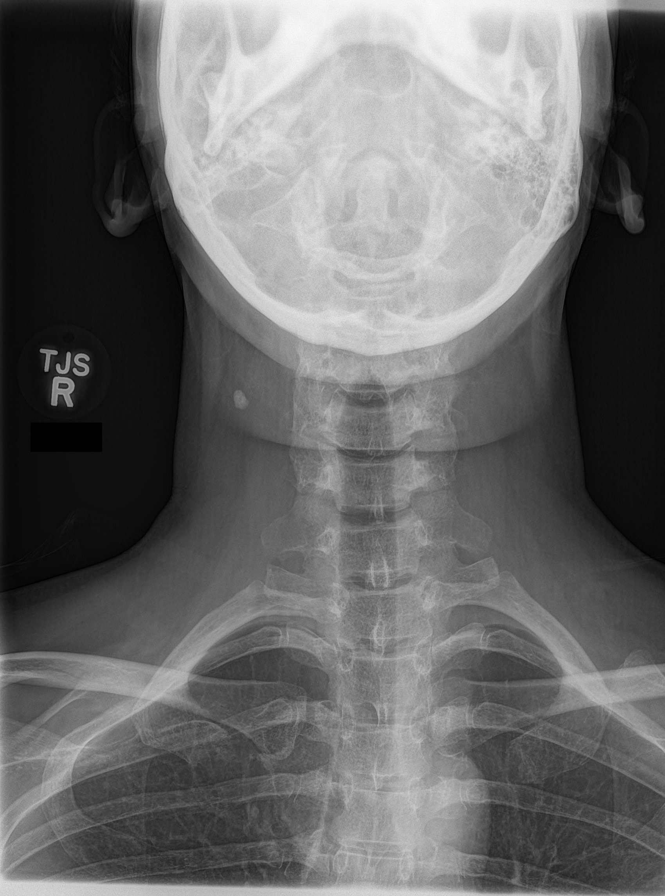
[im 4/7]
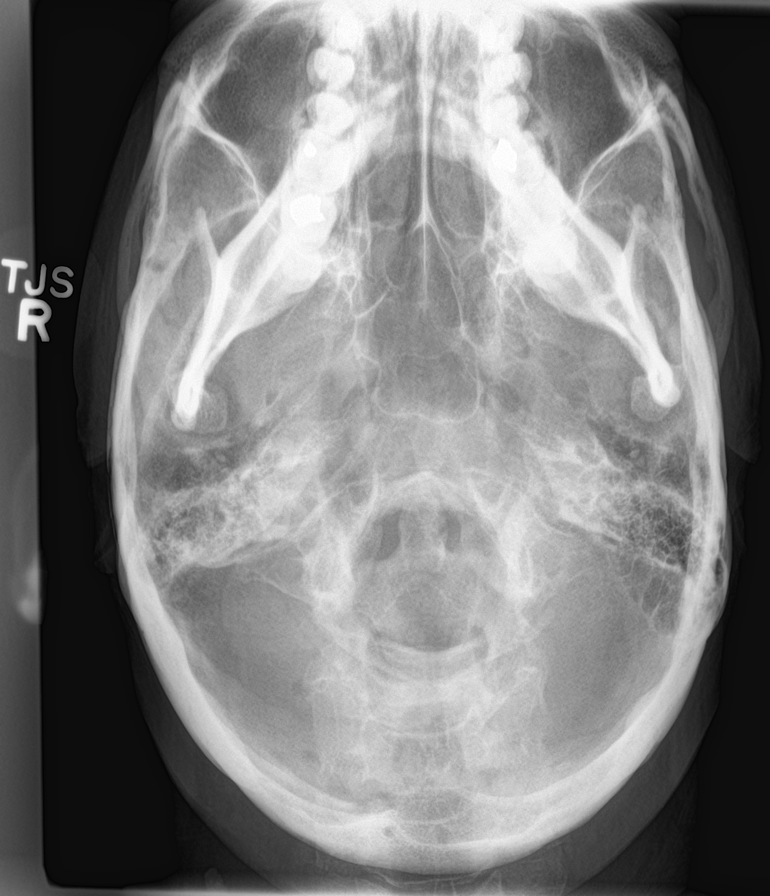
[im 5/7]
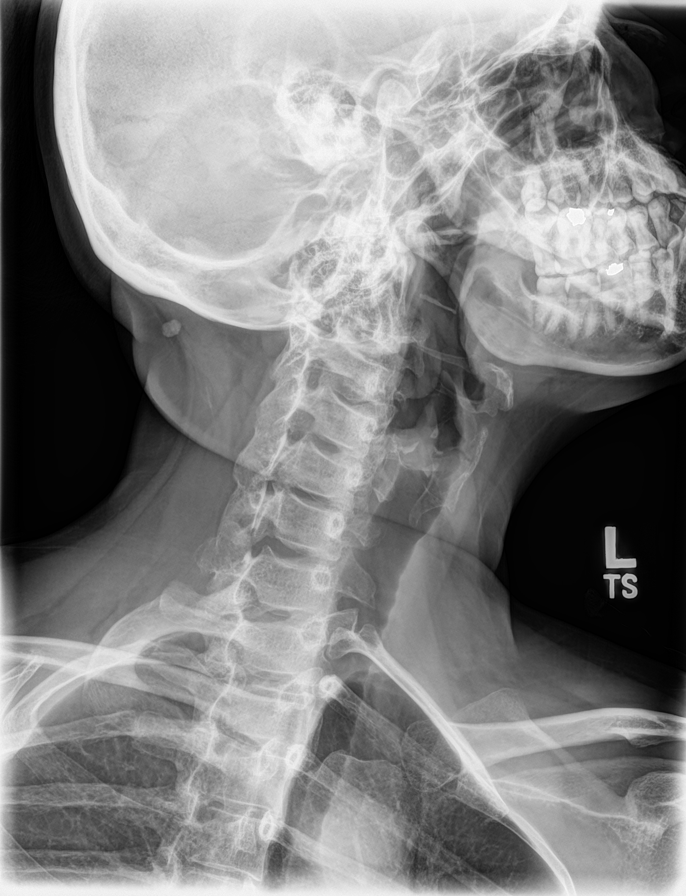
[im 6/7]
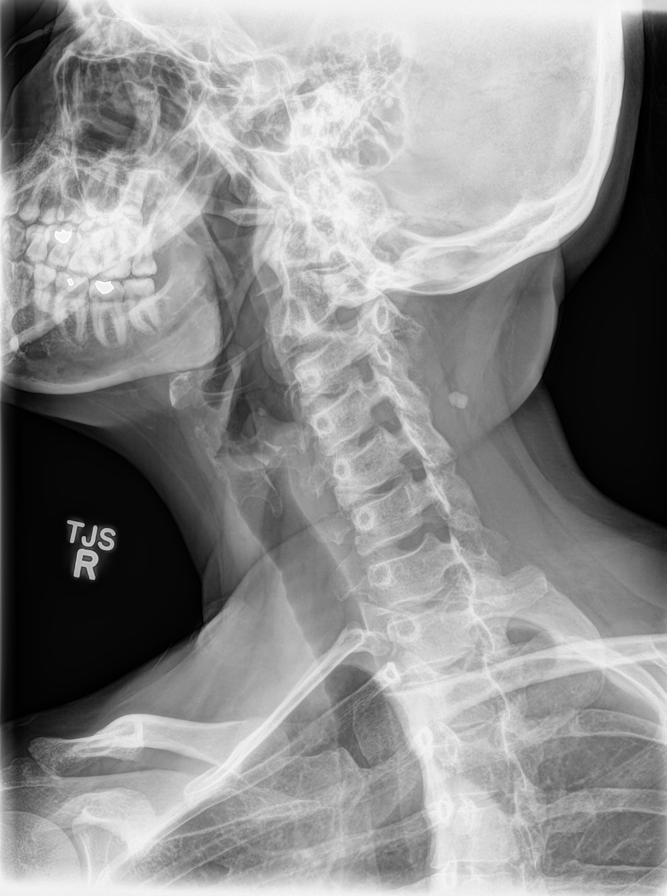
[im 7/7]
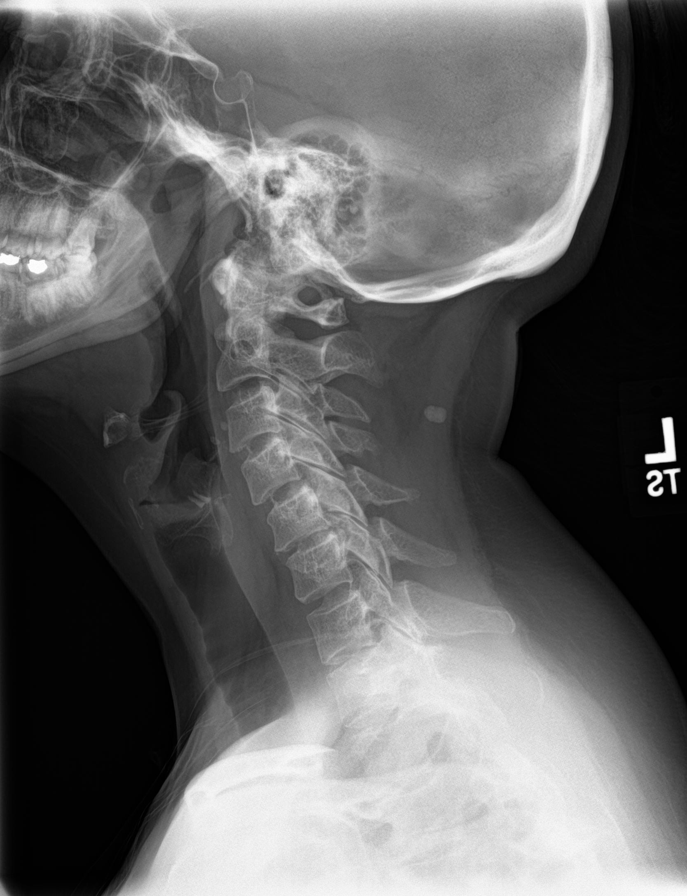

[7 of 7 positions shown; findings below may reference images not displayed]

FINDINGS: There is no evidence of cervical spine fracture or prevertebral soft
tissue swelling. Mild C5-6 disc space narrowing. Nuchal ligament
ossification posterior to C3. Alignment is normal. No other
significant bone abnormalities are identified.
IMPRESSION: No acute fracture.  Slight disc space narrowing at C5-6.

## 2018-11-05 ENCOUNTER — Telehealth: Payer: Self-pay | Admitting: Physician Assistant

## 2018-11-05 NOTE — Telephone Encounter (Signed)
Pt returning missed call.  Please call pt back if needed. ° °Thanks, °[facility] °

## 2018-11-06 NOTE — Telephone Encounter (Signed)
I don't see that we call her.Closing encounter.
